# Patient Record
Sex: Female | Born: 1975 | Hispanic: No | Marital: Married | Smoking: Never smoker
Health system: Southern US, Community
[De-identification: ages and names within clinical notes are randomized; demographics above are authoritative.]

## PROBLEM LIST (undated history)

## (undated) DIAGNOSIS — D649 Anemia, unspecified: Secondary | ICD-10-CM

## (undated) DIAGNOSIS — I10 Essential (primary) hypertension: Secondary | ICD-10-CM

## (undated) DIAGNOSIS — E559 Vitamin D deficiency, unspecified: Secondary | ICD-10-CM

## (undated) HISTORY — DX: Anemia, unspecified: D64.9

## (undated) HISTORY — DX: Vitamin D deficiency, unspecified: E55.9

## (undated) HISTORY — DX: Essential (primary) hypertension: I10

---

## 2021-10-31 DIAGNOSIS — Z6841 Body Mass Index (BMI) 40.0 and over, adult: Secondary | ICD-10-CM | POA: Diagnosis not present

## 2021-10-31 DIAGNOSIS — R635 Abnormal weight gain: Secondary | ICD-10-CM | POA: Diagnosis not present

## 2021-10-31 DIAGNOSIS — I10 Essential (primary) hypertension: Secondary | ICD-10-CM | POA: Diagnosis not present

## 2021-11-10 ENCOUNTER — Other Ambulatory Visit (HOSPITAL_BASED_OUTPATIENT_CLINIC_OR_DEPARTMENT_OTHER): Payer: Self-pay

## 2021-11-10 DIAGNOSIS — R0683 Snoring: Secondary | ICD-10-CM

## 2021-11-10 DIAGNOSIS — R5383 Other fatigue: Secondary | ICD-10-CM

## 2021-11-10 DIAGNOSIS — I10 Essential (primary) hypertension: Secondary | ICD-10-CM | POA: Diagnosis not present

## 2021-11-10 DIAGNOSIS — E785 Hyperlipidemia, unspecified: Secondary | ICD-10-CM | POA: Diagnosis not present

## 2021-11-10 DIAGNOSIS — Z0000 Encounter for general adult medical examination without abnormal findings: Secondary | ICD-10-CM | POA: Diagnosis not present

## 2021-11-10 DIAGNOSIS — R0681 Apnea, not elsewhere classified: Secondary | ICD-10-CM

## 2021-11-25 ENCOUNTER — Other Ambulatory Visit: Payer: Self-pay | Admitting: Family Medicine

## 2021-11-25 DIAGNOSIS — Z1231 Encounter for screening mammogram for malignant neoplasm of breast: Secondary | ICD-10-CM

## 2021-12-22 DIAGNOSIS — Z0000 Encounter for general adult medical examination without abnormal findings: Secondary | ICD-10-CM | POA: Diagnosis not present

## 2021-12-22 DIAGNOSIS — Z124 Encounter for screening for malignant neoplasm of cervix: Secondary | ICD-10-CM | POA: Diagnosis not present

## 2021-12-23 ENCOUNTER — Ambulatory Visit
Admission: RE | Admit: 2021-12-23 | Discharge: 2021-12-23 | Disposition: A | Discharge status: Home / Self Care | Payer: BC Managed Care – PPO | Source: Ambulatory Visit | Attending: Family Medicine | Admitting: Family Medicine

## 2021-12-23 ENCOUNTER — Other Ambulatory Visit: Payer: Self-pay | Admitting: Family Medicine

## 2021-12-23 ENCOUNTER — Other Ambulatory Visit: Payer: Self-pay

## 2021-12-23 DIAGNOSIS — M25511 Pain in right shoulder: Secondary | ICD-10-CM

## 2022-01-18 ENCOUNTER — Ambulatory Visit: Payer: BC Managed Care – PPO

## 2022-06-10 DIAGNOSIS — L0201 Cutaneous abscess of face: Secondary | ICD-10-CM | POA: Diagnosis not present

## 2022-08-02 DIAGNOSIS — D649 Anemia, unspecified: Secondary | ICD-10-CM | POA: Diagnosis not present

## 2022-08-02 DIAGNOSIS — R635 Abnormal weight gain: Secondary | ICD-10-CM | POA: Diagnosis not present

## 2022-08-02 DIAGNOSIS — Z1329 Encounter for screening for other suspected endocrine disorder: Secondary | ICD-10-CM | POA: Diagnosis not present

## 2022-08-02 DIAGNOSIS — E559 Vitamin D deficiency, unspecified: Secondary | ICD-10-CM | POA: Diagnosis not present

## 2022-08-02 DIAGNOSIS — R7303 Prediabetes: Secondary | ICD-10-CM | POA: Diagnosis not present

## 2022-08-02 DIAGNOSIS — E7800 Pure hypercholesterolemia, unspecified: Secondary | ICD-10-CM | POA: Diagnosis not present

## 2022-08-02 DIAGNOSIS — N951 Menopausal and female climacteric states: Secondary | ICD-10-CM | POA: Diagnosis not present

## 2022-08-04 DIAGNOSIS — Z1331 Encounter for screening for depression: Secondary | ICD-10-CM | POA: Diagnosis not present

## 2022-08-04 DIAGNOSIS — Z1339 Encounter for screening examination for other mental health and behavioral disorders: Secondary | ICD-10-CM | POA: Diagnosis not present

## 2022-08-04 DIAGNOSIS — E559 Vitamin D deficiency, unspecified: Secondary | ICD-10-CM | POA: Diagnosis not present

## 2022-08-10 DIAGNOSIS — E7800 Pure hypercholesterolemia, unspecified: Secondary | ICD-10-CM | POA: Diagnosis not present

## 2022-08-17 DIAGNOSIS — R7303 Prediabetes: Secondary | ICD-10-CM | POA: Diagnosis not present

## 2022-08-29 DIAGNOSIS — M25562 Pain in left knee: Secondary | ICD-10-CM | POA: Diagnosis not present

## 2022-08-29 DIAGNOSIS — E7800 Pure hypercholesterolemia, unspecified: Secondary | ICD-10-CM | POA: Diagnosis not present

## 2022-09-05 DIAGNOSIS — R7303 Prediabetes: Secondary | ICD-10-CM | POA: Diagnosis not present

## 2022-09-05 DIAGNOSIS — E559 Vitamin D deficiency, unspecified: Secondary | ICD-10-CM | POA: Diagnosis not present

## 2022-09-05 DIAGNOSIS — Z6841 Body Mass Index (BMI) 40.0 and over, adult: Secondary | ICD-10-CM | POA: Diagnosis not present

## 2022-09-19 DIAGNOSIS — R7303 Prediabetes: Secondary | ICD-10-CM | POA: Diagnosis not present

## 2022-09-19 DIAGNOSIS — Z6841 Body Mass Index (BMI) 40.0 and over, adult: Secondary | ICD-10-CM | POA: Diagnosis not present

## 2022-09-28 DIAGNOSIS — J208 Acute bronchitis due to other specified organisms: Secondary | ICD-10-CM | POA: Diagnosis not present

## 2022-09-28 DIAGNOSIS — H6691 Otitis media, unspecified, right ear: Secondary | ICD-10-CM | POA: Diagnosis not present

## 2022-10-04 DIAGNOSIS — J208 Acute bronchitis due to other specified organisms: Secondary | ICD-10-CM | POA: Insufficient documentation

## 2022-10-10 ENCOUNTER — Encounter: Payer: Self-pay | Admitting: Radiology

## 2022-10-10 ENCOUNTER — Ambulatory Visit: Payer: BC Managed Care – PPO | Admitting: Radiology

## 2022-10-10 VITALS — BP 144/96 | Ht 61.25 in | Wt 222.0 lb

## 2022-10-10 DIAGNOSIS — N939 Abnormal uterine and vaginal bleeding, unspecified: Secondary | ICD-10-CM | POA: Diagnosis not present

## 2022-10-10 LAB — PREGNANCY, URINE: Preg Test, Ur: NEGATIVE

## 2022-10-10 NOTE — Progress Notes (Signed)
   Katrina Moss 09/10/1976 [id number]   History:  47 y.o. G4P4 presents as a new patient with c/o heavy irregular bleeding. She had a 30day period starting 11/25, went from heavy to light then heavy again. Stopped for 2-3 days then started up again and was bright red and heavy x 6 days. No pain,or other associated symptoms. She denies any other problems. Has never had a heavy period or prolonged bleeding like this before. She has changed her diet to help with weight loss but otherwise no new changes.   Gynecologic History Patient's last menstrual period was 09/25/2022 (exact date). Period Pattern: (!) Irregular Contraception/Family planning: vasectomy Sexually active: yes   Obstetric History OB History  Gravida Para Term Preterm AB Living  '4 4       3  '$ SAB IAB Ectopic Multiple Live Births               # Outcome Date GA Lbr Len/2nd Weight Sex Delivery Anes PTL Lv  4 Para           3 Para           2 Para           1 Para              The following portions of the patient's history were reviewed and updated as appropriate: allergies, current medications, past family history, past medical history, past social history, past surgical history, and problem list.  Review of Systems Pertinent items noted in HPI and remainder of comprehensive ROS otherwise negative.   Past medical history, past surgical history, family history and social history were all reviewed and documented in the EPIC chart.   Exam:  Vitals:   10/10/22 1039 10/10/22 1050  BP: (!) 136/98 (!) 144/96  Weight: 222 lb (100.7 kg)   Height: 5' 1.25" (1.556 m)    Body mass index is 41.6 kg/m.  General appearance:  Normal Abdominal  Soft,nontender, without masses, guarding or rebound.  Liver/spleen:  No organomegaly noted  Hernia:  None appreciated  Skin  Inspection:  Grossly normal Genitourinary   Inguinal/mons:  Normal without inguinal adenopathy  External genitalia:  Normal appearing vulva with no  masses, tenderness, or lesions  BUS/Urethra/Skene's glands:  Normal without masses or exudate  Vagina:  Normal appearing with normal color and discharge, no lesions  Cervix:  Normal appearing without discharge or lesions  Uterus:  Normal in size, shape and contour.  Mobile, nontender  Adnexa/parametria:     Rt: Normal in size, without masses or tenderness.   Lt: Normal in size, without masses or tenderness.  Anus and perineum: Normal   Patient informed chaperone available to be present for breast and pelvic exam. Patient has requested no chaperone to be present. Patient has been advised what will be completed during breast and pelvic exam.   Assessment/Plan:   1. Abnormal vaginal bleeding  - Pregnancy, urine; negative - US Transvaginal Non-OB; Future   Will wait to see what u/s shows before starting any medication to manage. Pt agreeable to plan.  Rubbie Battiest B WHNP-BC 11:15 AM 10/10/2022

## 2022-10-12 ENCOUNTER — Ambulatory Visit (INDEPENDENT_AMBULATORY_CARE_PROVIDER_SITE_OTHER): Payer: BC Managed Care – PPO

## 2022-10-12 ENCOUNTER — Ambulatory Visit (INDEPENDENT_AMBULATORY_CARE_PROVIDER_SITE_OTHER): Payer: BC Managed Care – PPO | Admitting: Radiology

## 2022-10-12 VITALS — BP 138/84

## 2022-10-12 DIAGNOSIS — N8003 Adenomyosis of the uterus: Secondary | ICD-10-CM | POA: Diagnosis not present

## 2022-10-12 DIAGNOSIS — N939 Abnormal uterine and vaginal bleeding, unspecified: Secondary | ICD-10-CM

## 2022-10-12 DIAGNOSIS — D219 Benign neoplasm of connective and other soft tissue, unspecified: Secondary | ICD-10-CM | POA: Diagnosis not present

## 2022-10-12 DIAGNOSIS — N921 Excessive and frequent menstruation with irregular cycle: Secondary | ICD-10-CM | POA: Diagnosis not present

## 2022-10-12 DIAGNOSIS — E611 Iron deficiency: Secondary | ICD-10-CM | POA: Diagnosis not present

## 2022-10-12 DIAGNOSIS — D75839 Thrombocytosis, unspecified: Secondary | ICD-10-CM | POA: Diagnosis not present

## 2022-10-12 DIAGNOSIS — D500 Iron deficiency anemia secondary to blood loss (chronic): Secondary | ICD-10-CM

## 2022-10-12 NOTE — Progress Notes (Signed)
   Katrina Moss 1976-07-25 [id number]   History:  47 y.o. G4P4 presents for follow up U/S after presenting as a new patient with c/o heavy irregular bleeding. She had a 30day period starting 11/25, went from heavy to light then heavy again. Stopped for 2-3 days then started up again and was bright red and heavy x 6 days. No pain,or other associated symptoms. She denies any other problems. Has never had a heavy period or prolonged bleeding like this before. She has changed her diet to help with weight loss but otherwise no new changes.   Gynecologic History Patient's last menstrual period was 09/25/2022 (exact date).   Contraception/Family planning: vasectomy Sexually active: yes   Obstetric History OB History  Gravida Para Term Preterm AB Living  '4 4       3  '$ SAB IAB Ectopic Multiple Live Births               # Outcome Date GA Lbr Len/2nd Weight Sex Delivery Anes PTL Lv  4 Para           3 Para           2 Para           1 Para              The following portions of the patient's history were reviewed and updated as appropriate: allergies, current medications, past family history, past medical history, past social history, past surgical history, and problem list.  Review of Systems Pertinent items noted in HPI and remainder of comprehensive ROS otherwise negative.   Past medical history, past surgical history, family history and social history were all reviewed and documented in the EPIC chart.   Exam:  Vitals:   10/12/22 1522  BP: 138/84   There is no height or weight on file to calculate BMI.  Narrative & Impression Indication: menorrhagia   Vaginal u/s   Anteverted slightly enlarged fibroid uterus 10.66 x 7.03 x 6.03cm In homogenous myometrium with streaky shadowing ?adenomyosis Several small fibroid with the largest measuring 1.87 x 1.55cm   Thin, symmetrical endometrium 3.8m No masses or thickening seen    Both ovaries normal size with normal follicle  pattern Dominant follicle pattern on the right side 26 x 173mcycle day 17   No adnexal mass No free fluid   Impression: fibroids, adenomyosis    Assessment/Plan:   1. Fibroids 2. Adenomyosis 3. Menorrhagia with irregular cycle - CBC - Ferritin - B12 and Folate Panel 4. Iron deficiency anemia due to chronic blood loss  - CBC - Ferritin - B12 and Folate Panel   Discussed adenomyosis and fibroid with patient. Discussed treatment options including expectant management, IUD placement, OCPs, Lysteda, Myfembree. Will consider and return with decision.   CHRubbie Battiest WHNP-BC 4:36 PM 10/12/2022

## 2022-10-13 ENCOUNTER — Telehealth: Payer: Self-pay

## 2022-10-13 DIAGNOSIS — R7989 Other specified abnormal findings of blood chemistry: Secondary | ICD-10-CM

## 2022-10-13 LAB — CBC
HCT: 35.9 % (ref 35.0–45.0)
Hemoglobin: 12.1 g/dL (ref 11.7–15.5)
MCH: 27.9 pg (ref 27.0–33.0)
MCHC: 33.7 g/dL (ref 32.0–36.0)
MCV: 82.7 fL (ref 80.0–100.0)
MPV: 9.4 fL (ref 7.5–12.5)
Platelets: 518 10*3/uL — ABNORMAL HIGH (ref 140–400)
RBC: 4.34 10*6/uL (ref 3.80–5.10)
RDW: 13.3 % (ref 11.0–15.0)
WBC: 6 10*3/uL (ref 3.8–10.8)

## 2022-10-13 LAB — B12 AND FOLATE PANEL
Folate: 9.8 ng/mL
Vitamin B-12: 613 pg/mL (ref 200–1100)

## 2022-10-13 LAB — FERRITIN: Ferritin: 14 ng/mL — ABNORMAL LOW (ref 16–232)

## 2022-10-13 NOTE — Telephone Encounter (Signed)
Per result note from 10/14/2022--referral sent.

## 2022-10-26 DIAGNOSIS — R7303 Prediabetes: Secondary | ICD-10-CM | POA: Diagnosis not present

## 2022-11-03 ENCOUNTER — Encounter: Payer: Self-pay | Admitting: Hematology and Oncology

## 2022-11-03 DIAGNOSIS — D75839 Thrombocytosis, unspecified: Secondary | ICD-10-CM | POA: Insufficient documentation

## 2022-11-03 DIAGNOSIS — E611 Iron deficiency: Secondary | ICD-10-CM | POA: Insufficient documentation

## 2022-11-06 ENCOUNTER — Inpatient Hospital Stay: Payer: BC Managed Care – PPO | Attending: Hematology and Oncology | Admitting: Hematology and Oncology

## 2022-11-06 ENCOUNTER — Inpatient Hospital Stay: Payer: BC Managed Care – PPO

## 2022-11-06 ENCOUNTER — Encounter: Payer: Self-pay | Admitting: Hematology and Oncology

## 2022-11-06 VITALS — BP 152/104 | HR 89 | Temp 98.6°F | Resp 18 | Ht 62.0 in | Wt 223.6 lb

## 2022-11-06 DIAGNOSIS — E611 Iron deficiency: Secondary | ICD-10-CM | POA: Insufficient documentation

## 2022-11-06 DIAGNOSIS — N921 Excessive and frequent menstruation with irregular cycle: Secondary | ICD-10-CM | POA: Insufficient documentation

## 2022-11-06 DIAGNOSIS — I10 Essential (primary) hypertension: Secondary | ICD-10-CM | POA: Insufficient documentation

## 2022-11-06 DIAGNOSIS — D75838 Other thrombocytosis: Secondary | ICD-10-CM | POA: Insufficient documentation

## 2022-11-06 DIAGNOSIS — Z803 Family history of malignant neoplasm of breast: Secondary | ICD-10-CM | POA: Diagnosis not present

## 2022-11-06 DIAGNOSIS — D75839 Thrombocytosis, unspecified: Secondary | ICD-10-CM

## 2022-11-06 NOTE — Assessment & Plan Note (Signed)
I would defer to her gynecologist for management

## 2022-11-06 NOTE — Progress Notes (Signed)
[facility] CONSULT NOTE  Patient Care Team: Pcp, No as PCP - General  ASSESSMENT & PLAN:  Iron deficiency Most likely cause of her iron deficiency is due to chronic menorrhagia She appears to be tolerating oral iron supplement well I recommend she continues the same I plan to repeat her blood work again in a few months for further follow-up If she is not able to improve her iron studies with oral iron supplement, I will bring her in and prescribe intravenous iron replacement therapy At her age, she will be due for colonoscopy I recommend the patient to get established with new primary care doctor If she is not able to get established with a primary care doctor by her next visit, I will set up referral for her to get colonoscopy screening  Thrombocytosis This is reactive thrombocytosis related to iron deficiency I anticipate resolution of thrombocytosis with adequate oral iron replacement therapy  Menorrhagia with irregular cycle I would defer to her gynecologist for management Orders Placed This Encounter  Procedures   Iron and Iron Binding Capacity ([facility] only)    Standing Status:   Future    Standing Expiration Date:   11/07/2023   Ferritin    Standing Status:   Future    Standing Expiration Date:   11/06/2023   CBC with Differential (Cancer Center Only)    Standing Status:   Future    Standing Expiration Date:   11/07/2023   Reticulocytes    Standing Status:   Future    Standing Expiration Date:   11/07/2023   Sedimentation rate    Standing Status:   Future    Standing Expiration Date:   11/07/2023    All questions were answered. The patient knows to call the clinic with any problems, questions or concerns.  The total time spent in the appointment was 55 minutes encounter with patients including review of chart and various tests results, discussions about plan of care and coordination of care plan  [name], MD 2/5/20243:07 PM   CHIEF COMPLAINTS/PURPOSE  OF CONSULTATION:  Elevated platelet count  HISTORY OF PRESENTING ILLNESS:  Katrina Moss 47 y.o. female is here because of elevated platelet count She is here accompanied by her husband Both of them just relocated from [place] and of last year The patient was otherwise healthy Starting around end of November, she started to develop irregular menstruation with the continuous menstrual bleed that lasted for several weeks According to the patient, she started to have irregular menstruation since around 2021 Prior to that, on average, she has 6 days of menstrual cycle every month that was regular in pattern The patient has 3 pregnancies and normal vaginal delivery After continuous bleeding, her menstrual cycles subsequently ceased early January but then resume again around January 13 and lasted for some time She has no further menstrual bleeding since end of January She started taking oral iron supplement recently with an empty stomach She denies side effects from iron supplement From the elevated platelet standpoint, she has never suffered from a blood clot We do not have any baseline blood work due to recent relocation from [place]:  Past Medical History:  Diagnosis Date   Hypertension     SURGICAL HISTORY: History reviewed. No pertinent surgical history.  SOCIAL HISTORY: Social History   Socioeconomic History   Marital status: Unknown    Spouse name: Not on file   Number of children: 3   Years of education: Not on  file   Highest education level: Not on file  Occupational History   Not on file  Tobacco Use   Smoking status: Never    Passive exposure: Never   Smokeless tobacco: Never  Substance and Sexual Activity   Alcohol use: Not Currently   Drug use: Never   Sexual activity: Yes    Partners: Male    Birth control/protection: Surgical    Comment: partner vasectomy, menarche 78yo, sexual debut 47yo  Other Topics Concern   Not on file   Social History Narrative   Not on file   Social Determinants of Health   Financial Resource Strain: Not on file  Food Insecurity: Not on file  Transportation Needs: Not on file  Physical Activity: Not on file  Stress: Not on file  Social Connections: Not on file  Intimate Partner Violence: Not on file    FAMILY HISTORY: Family History  Problem Relation Age of Onset   Breast cancer Mother        over 33 at onset per patient    ALLERGIES:  is allergic to pamelor [nortriptyline].  MEDICATIONS:  Current Outpatient Medications  Medication Sig Dispense Refill   Cholecalciferol (VITAMIN D3) 50 MCG (2000 UT) capsule Take 2,000 Units by mouth daily.     Ferrous Sulfate (IRON PO) Take 1 tablet by mouth at bedtime.     MAGNESIUM PO Take by mouth.     No current facility-administered medications for this visit.    REVIEW OF SYSTEMS:   Constitutional: Denies fevers, chills or abnormal night sweats Eyes: Denies blurriness of vision, double vision or watery eyes Ears, nose, mouth, throat, and face: Denies mucositis or sore throat Respiratory: Denies cough, dyspnea or wheezes Cardiovascular: Denies palpitation, chest discomfort or lower extremity swelling Gastrointestinal:  Denies nausea, heartburn or change in bowel habits Skin: Denies abnormal skin rashes Lymphatics: Denies new lymphadenopathy or easy bruising Neurological:Denies numbness, tingling or new weaknesses Behavioral/Psych: Mood is stable, no new changes  All other systems were reviewed with the patient and are negative.  PHYSICAL EXAMINATION: ECOG PERFORMANCE STATUS: 0 - Asymptomatic  Vitals:   11/06/22 1114  BP: (!) 152/104  Pulse: 89  Resp: 18  Temp: 98.6 F (37 C)  SpO2: 100%   Filed Weights   11/06/22 1114  Weight: 223 lb 9.6 oz (101.4 kg)    GENERAL:alert, no distress and comfortable SKIN: skin color, texture, turgor are normal, no rashes or significant lesions EYES: normal, conjunctiva are pink  and non-injected, sclera clear OROPHARYNX:no exudate, no erythema and lips, buccal mucosa, and tongue normal  NECK: supple, thyroid normal size, non-tender, without nodularity LYMPH:  no palpable lymphadenopathy in the cervical, axillary or inguinal LUNGS: clear to auscultation and percussion with normal breathing effort HEART: regular rate & rhythm and no murmurs and no lower extremity edema ABDOMEN:abdomen soft, non-tender and normal bowel sounds Musculoskeletal:no cyanosis of digits and no clubbing  PSYCH: alert & oriented x 3 with fluent speech NEURO: no focal motor/sensory deficits  RADIOGRAPHIC STUDIES: I have personally reviewed the radiological images as listed and agreed with the findings in the report. US Transvaginal Non-OB  Result Date: 10/12/2022 Indication: menorrhagia Vaginal u/s Anteverted slightly enlarged fibroid uterus 10.66 x 7.03 x 6.03cm In homogenous myometrium with streaky shadowing ?adenomyosis Several small fibroid with the largest measuring 1.87 x 1.55cm Thin, symmetrical endometrium 3.28m No masses or thickening seen Both ovaries normal size with normal follicle pattern Dominant follicle pattern on the right side 26 x 140mcycle day  17 No adnexal mass No free fluid Impression: fibroids, adenomyosis

## 2022-11-06 NOTE — Assessment & Plan Note (Addendum)
Most likely cause of her iron deficiency is due to chronic menorrhagia She appears to be tolerating oral iron supplement well I recommend she continues the same I plan to repeat her blood work again in a few months for further follow-up If she is not able to improve her iron studies with oral iron supplement, I will bring her in and prescribe intravenous iron replacement therapy At her age, she will be due for colonoscopy I recommend the patient to get established with new primary care doctor If she is not able to get established with a primary care doctor by her next visit, I will set up referral for her to get colonoscopy screening

## 2022-11-06 NOTE — Assessment & Plan Note (Signed)
This is reactive thrombocytosis related to iron deficiency I anticipate resolution of thrombocytosis with adequate oral iron replacement therapy

## 2022-11-09 DIAGNOSIS — E7800 Pure hypercholesterolemia, unspecified: Secondary | ICD-10-CM | POA: Diagnosis not present

## 2022-11-09 DIAGNOSIS — E559 Vitamin D deficiency, unspecified: Secondary | ICD-10-CM | POA: Diagnosis not present

## 2022-11-09 DIAGNOSIS — Z6841 Body Mass Index (BMI) 40.0 and over, adult: Secondary | ICD-10-CM | POA: Diagnosis not present

## 2022-11-10 NOTE — Telephone Encounter (Signed)
Pt was seen on 11/06/2022. Will close encounter.

## 2023-01-30 ENCOUNTER — Inpatient Hospital Stay: Payer: BC Managed Care – PPO | Attending: Hematology and Oncology

## 2023-01-30 ENCOUNTER — Other Ambulatory Visit: Payer: Self-pay

## 2023-01-30 DIAGNOSIS — D509 Iron deficiency anemia, unspecified: Secondary | ICD-10-CM | POA: Diagnosis not present

## 2023-01-30 DIAGNOSIS — E611 Iron deficiency: Secondary | ICD-10-CM

## 2023-01-30 DIAGNOSIS — D75839 Thrombocytosis, unspecified: Secondary | ICD-10-CM

## 2023-01-30 DIAGNOSIS — N921 Excessive and frequent menstruation with irregular cycle: Secondary | ICD-10-CM | POA: Insufficient documentation

## 2023-01-30 LAB — CBC WITH DIFFERENTIAL (CANCER CENTER ONLY)
Abs Immature Granulocytes: 0.01 10*3/uL (ref 0.00–0.07)
Basophils Absolute: 0 10*3/uL (ref 0.0–0.1)
Basophils Relative: 0 %
Eosinophils Absolute: 0.1 10*3/uL (ref 0.0–0.5)
Eosinophils Relative: 1 %
HCT: 34.9 % — ABNORMAL LOW (ref 36.0–46.0)
Hemoglobin: 11.7 g/dL — ABNORMAL LOW (ref 12.0–15.0)
Immature Granulocytes: 0 %
Lymphocytes Relative: 42 %
Lymphs Abs: 2 10*3/uL (ref 0.7–4.0)
MCH: 28.3 pg (ref 26.0–34.0)
MCHC: 33.5 g/dL (ref 30.0–36.0)
MCV: 84.3 fL (ref 80.0–100.0)
Monocytes Absolute: 0.4 10*3/uL (ref 0.1–1.0)
Monocytes Relative: 8 %
Neutro Abs: 2.3 10*3/uL (ref 1.7–7.7)
Neutrophils Relative %: 49 %
Platelet Count: 393 10*3/uL (ref 150–400)
RBC: 4.14 MIL/uL (ref 3.87–5.11)
RDW: 13.3 % (ref 11.5–15.5)
WBC Count: 4.8 10*3/uL (ref 4.0–10.5)
nRBC: 0 % (ref 0.0–0.2)

## 2023-01-30 LAB — FERRITIN: Ferritin: 13 ng/mL (ref 11–307)

## 2023-01-30 LAB — RETICULOCYTES
Immature Retic Fract: 4 % (ref 2.3–15.9)
RBC.: 4.24 MIL/uL (ref 3.87–5.11)
Retic Count, Absolute: 41.6 10*3/uL (ref 19.0–186.0)
Retic Ct Pct: 1 % (ref 0.4–3.1)

## 2023-01-30 LAB — SEDIMENTATION RATE: Sed Rate: 43 mm/hr — ABNORMAL HIGH (ref 0–22)

## 2023-01-30 LAB — IRON AND IRON BINDING CAPACITY (CC-WL,HP ONLY)
Iron: 52 ug/dL (ref 28–170)
Saturation Ratios: 12 % (ref 10.4–31.8)
TIBC: 445 ug/dL (ref 250–450)
UIBC: 393 ug/dL (ref 148–442)

## 2023-02-01 ENCOUNTER — Telehealth: Payer: Self-pay | Admitting: Hematology and Oncology

## 2023-02-01 ENCOUNTER — Inpatient Hospital Stay: Payer: BC Managed Care – PPO | Attending: Hematology and Oncology | Admitting: Hematology and Oncology

## 2023-02-01 ENCOUNTER — Encounter: Payer: Self-pay | Admitting: Hematology and Oncology

## 2023-02-01 DIAGNOSIS — N921 Excessive and frequent menstruation with irregular cycle: Secondary | ICD-10-CM

## 2023-02-01 DIAGNOSIS — E611 Iron deficiency: Secondary | ICD-10-CM

## 2023-02-01 NOTE — Assessment & Plan Note (Signed)
She has worsening anemia with persistent iron deficiency despite oral iron supplement The most likely cause of her anemia is due to chronic blood loss/malabsorption syndrome. We discussed some of the risks, benefits, and alternatives of intravenous iron infusions. The patient is symptomatic from anemia and the iron level is critically low. She tolerated oral iron supplement poorly and desires to achieved higher levels of iron faster for adequate hematopoesis. Some of the side-effects to be expected including risks of infusion reactions, phlebitis, headaches, nausea and fatigue.  The patient is willing to proceed. Patient education material was dispensed.  Goal is to keep ferritin level greater than 50 and resolution of anemia She agreed to proceed with IV iron I recommend 1 dose of iron sucrose and repeat labs in 3 months

## 2023-02-01 NOTE — Progress Notes (Signed)
HEMATOLOGY-ONCOLOGY ELECTRONIC VISIT PROGRESS NOTE  Patient Care Team: Pcp, No as PCP - General  I connected with the patient via telephone conference and verified that I am speaking with the correct person using two identifiers. The patient's location is at home and I am providing care from the [facility] I discussed the limitations, risks, security and privacy concerns of performing an evaluation and management service by e-visits and the availability of in person appointments.  I also discussed with the patient that there may be a patient responsible charge related to this service. The patient expressed understanding and agreed to proceed.   ASSESSMENT & PLAN:  Iron deficiency She has worsening anemia with persistent iron deficiency despite oral iron supplement The most likely cause of her anemia is due to chronic blood loss/malabsorption syndrome. We discussed some of the risks, benefits, and alternatives of intravenous iron infusions. The patient is symptomatic from anemia and the iron level is critically low. She tolerated oral iron supplement poorly and desires to achieved higher levels of iron faster for adequate hematopoesis. Some of the side-effects to be expected including risks of infusion reactions, phlebitis, headaches, nausea and fatigue.  The patient is willing to proceed. Patient education material was dispensed.  Goal is to keep ferritin level greater than 50 and resolution of anemia She agreed to proceed with IV iron I recommend 1 dose of iron sucrose and repeat labs in 3 months  Menorrhagia with irregular cycle Her menorrhagia has improved She will continue follow-up with gynecologist  Orders Placed This Encounter  Procedures   Iron and Iron Binding Capacity ([facility] only)    Standing Status:   Future    Standing Expiration Date:   02/01/2024   Ferritin    Standing Status:   Future    Standing Expiration Date:   02/01/2024   CBC with Differential (Cancer Center  Only)    Standing Status:   Future    Standing Expiration Date:   02/01/2024    INTERVAL HISTORY: Please see below for problem oriented charting. The purpose of today's discussion is to review recent test results Since her last visit, she denies menorrhagia Her energy level is fair but she has persistent fatigue The patient denies any recent signs or symptoms of bleeding such as spontaneous epistaxis, hematuria or hematochezia.  Summary of hematological history Katrina Moss 47 y.o. female is here because of elevated platelet count She is here accompanied by her husband Both of them just relocated from [place] and of last year The patient was otherwise healthy Starting around end of November, she started to develop irregular menstruation with the continuous menstrual bleed that lasted for several weeks According to the patient, she started to have irregular menstruation since around 2021 Prior to that, on average, she has 6 days of menstrual cycle every month that was regular in pattern The patient has 3 pregnancies and normal vaginal delivery After continuous bleeding, her menstrual cycles subsequently ceased early January but then resume again around January 13 and lasted for some time She has no further menstrual bleeding since end of January She started taking oral iron supplement recently with an empty stomach She denies side effects from iron supplement From the elevated platelet standpoint, she has never suffered from a blood clot We do not have any baseline blood work due to recent relocation from [place] She was seen in February 2024 with plan to continue oral iron supplement and recheck in a few months  REVIEW OF SYSTEMS:  Constitutional: Denies fevers, chills or abnormal weight loss Eyes: Denies blurriness of vision Ears, nose, mouth, throat, and face: Denies mucositis or sore throat Respiratory: Denies cough, dyspnea or wheezes Cardiovascular: Denies  palpitation, chest discomfort Gastrointestinal:  Denies nausea, heartburn or change in bowel habits Skin: Denies abnormal skin rashes Lymphatics: Denies new lymphadenopathy or easy bruising Neurological:Denies numbness, tingling or new weaknesses Behavioral/Psych: Mood is stable, no new changes  Extremities: No lower extremity edema All other systems were reviewed with the patient and are negative.  I have reviewed the past medical history, past surgical history, social history and family history with the patient and they are unchanged from previous note.  ALLERGIES:  is allergic to pamelor [nortriptyline].  MEDICATIONS:  Current Outpatient Medications  Medication Sig Dispense Refill   Cholecalciferol (VITAMIN D3) 50 MCG (2000 UT) capsule Take 2,000 Units by mouth daily.     Ferrous Sulfate (IRON PO) Take 1 tablet by mouth at bedtime.     MAGNESIUM PO Take by mouth.     No current facility-administered medications for this visit.    PHYSICAL EXAMINATION: ECOG PERFORMANCE STATUS: 0 - Asymptomatic  LABORATORY DATA:  I have reviewed the data as listed     No data to display          Lab Results  Component Value Date   WBC 4.8 01/30/2023   HGB 11.7 (L) 01/30/2023   HCT 34.9 (L) 01/30/2023   MCV 84.3 01/30/2023   PLT 393 01/30/2023   NEUTROABS 2.3 01/30/2023    I discussed the assessment and treatment plan with the patient. The patient was provided an opportunity to ask questions and all were answered. The patient agreed with the plan and demonstrated an understanding of the instructions. The patient was advised to call back or seek an in-person evaluation if the symptoms worsen or if the condition fails to improve as anticipated.    I spent 20 minutes for the appointment reviewing test results, discuss management and coordination of care.  Artis Delay, MD 02/01/2023 11:02 AM

## 2023-02-01 NOTE — Telephone Encounter (Signed)
Spoke with patient confirming upcoming appointments  

## 2023-02-01 NOTE — Assessment & Plan Note (Signed)
Her menorrhagia has improved She will continue follow-up with gynecologist

## 2023-02-20 ENCOUNTER — Telehealth: Payer: Self-pay

## 2023-02-20 ENCOUNTER — Inpatient Hospital Stay: Payer: BC Managed Care – PPO

## 2023-02-20 ENCOUNTER — Encounter: Payer: Self-pay | Admitting: Hematology and Oncology

## 2023-02-20 NOTE — Telephone Encounter (Signed)
Called and left a message that she did not show up for 8 am IV iron appt today. Ask her to call scheduling to reschedule appt.

## 2023-02-27 NOTE — Progress Notes (Deleted)
   New Patient Office Visit  Subjective    Patient ID: Katrina Moss, female    DOB: 1976-04-14  Age: 47 y.o. MRN: [id number]  CC: No chief complaint on file.   HPI Tynika Bedel presents to establish care with new provider.  Patients previous primary care provider was   Specialist: Hematology-[facility] with Dr. Artis Delay.  [facility] with Arlie Solomons, NP Outpatient Encounter Medications as of 02/28/2023  Medication Sig   Cholecalciferol (VITAMIN D3) 50 MCG (2000 UT) capsule Take 2,000 Units by mouth daily.   Ferrous Sulfate (IRON PO) Take 1 tablet by mouth at bedtime.   MAGNESIUM PO Take by mouth.   No facility-administered encounter medications on file as of 02/28/2023.    Past Medical History:  Diagnosis Date   Hypertension     No past surgical history on file.  Family History  Problem Relation Age of Onset   Breast cancer Mother        over 82 at onset per patient    Social History   Socioeconomic History   Marital status: Married    Spouse name: Not on file   Number of children: 3   Years of education: Not on file   Highest education level: Not on file  Occupational History   Not on file  Tobacco Use   Smoking status: Never    Passive exposure: Never   Smokeless tobacco: Never  Substance and Sexual Activity   Alcohol use: Not Currently   Drug use: Never   Sexual activity: Yes    Partners: Male    Birth control/protection: Surgical    Comment: partner vasectomy, menarche 89yo, sexual debut 47yo  Other Topics Concern   Not on file  Social History Narrative   Not on file   Social Determinants of Health   Financial Resource Strain: Not on file  Food Insecurity: Not on file  Transportation Needs: Not on file  Physical Activity: Not on file  Stress: Not on file  Social Connections: Not on file  Intimate Partner Violence: Not on file    ROS See HPI above    Objective     There were no vitals taken for this visit.  Physical Exam    Assessment & Plan:  There are no diagnoses linked to this encounter.  No follow-ups on file.  1.Review health maintenance:  -Covid vaccine -HIV and Hep C  -Tetanus vaccine -Pap smear -Colonoscopy Zandra Abts, NP

## 2023-02-28 ENCOUNTER — Ambulatory Visit: Payer: BC Managed Care – PPO | Admitting: Family Medicine

## 2023-02-28 DIAGNOSIS — I10 Essential (primary) hypertension: Secondary | ICD-10-CM | POA: Insufficient documentation

## 2023-03-01 ENCOUNTER — Encounter: Payer: Self-pay | Admitting: Family Medicine

## 2023-03-02 ENCOUNTER — Ambulatory Visit: Payer: BC Managed Care – PPO | Admitting: Family Medicine

## 2023-03-02 ENCOUNTER — Encounter: Payer: Self-pay | Admitting: Family Medicine

## 2023-03-02 ENCOUNTER — Telehealth: Payer: Self-pay

## 2023-03-02 VITALS — BP 124/78 | HR 82 | Temp 98.2°F | Ht 62.0 in | Wt 220.2 lb

## 2023-03-02 DIAGNOSIS — Z114 Encounter for screening for human immunodeficiency virus [HIV]: Secondary | ICD-10-CM | POA: Diagnosis not present

## 2023-03-02 DIAGNOSIS — Z0000 Encounter for general adult medical examination without abnormal findings: Secondary | ICD-10-CM | POA: Diagnosis not present

## 2023-03-02 DIAGNOSIS — Z13228 Encounter for screening for other metabolic disorders: Secondary | ICD-10-CM

## 2023-03-02 DIAGNOSIS — Z1211 Encounter for screening for malignant neoplasm of colon: Secondary | ICD-10-CM

## 2023-03-02 DIAGNOSIS — Z1329 Encounter for screening for other suspected endocrine disorder: Secondary | ICD-10-CM | POA: Diagnosis not present

## 2023-03-02 DIAGNOSIS — Z1159 Encounter for screening for other viral diseases: Secondary | ICD-10-CM

## 2023-03-02 DIAGNOSIS — Z7689 Persons encountering health services in other specified circumstances: Secondary | ICD-10-CM

## 2023-03-02 DIAGNOSIS — Z13220 Encounter for screening for lipoid disorders: Secondary | ICD-10-CM

## 2023-03-02 DIAGNOSIS — Z1231 Encounter for screening mammogram for malignant neoplasm of breast: Secondary | ICD-10-CM

## 2023-03-02 LAB — LIPID PANEL
Cholesterol: 198 mg/dL (ref 0–200)
HDL: 56.5 mg/dL (ref >39.00)
LDL Cholesterol: 129 mg/dL — ABNORMAL HIGH (ref 0–99)
NonHDL: 141.31
Total CHOL/HDL Ratio: 4
Triglycerides: 60 mg/dL (ref 0.0–149.0)
VLDL: 12 mg/dL (ref 0.0–40.0)

## 2023-03-02 LAB — COMPREHENSIVE METABOLIC PANEL
ALT: 7 U/L (ref 0–35)
AST: 13 U/L (ref 0–37)
Albumin: 4.5 g/dL (ref 3.5–5.2)
Alkaline Phosphatase: 75 U/L (ref 39–117)
BUN: 9 mg/dL (ref 6–23)
CO2: 29 mEq/L (ref 19–32)
Calcium: 9.4 mg/dL (ref 8.4–10.5)
Chloride: 103 mEq/L (ref 96–112)
Creatinine, Ser: 0.68 mg/dL (ref 0.40–1.20)
GFR: 104.93 mL/min (ref >60.00)
Glucose, Bld: 101 mg/dL — ABNORMAL HIGH (ref 70–99)
Potassium: 4.2 mEq/L (ref 3.5–5.1)
Sodium: 137 mEq/L (ref 135–145)
Total Bilirubin: 0.5 mg/dL (ref 0.2–1.2)
Total Protein: 8 g/dL (ref 6.0–8.3)

## 2023-03-02 LAB — HEMOGLOBIN A1C: Hgb A1c MFr Bld: 5.8 % (ref 4.6–6.5)

## 2023-03-02 LAB — TSH: TSH: 1.2 u[IU]/mL (ref 0.35–5.50)

## 2023-03-02 NOTE — Progress Notes (Signed)
New Patient Office Visit  Subjective    Patient ID: Katrina Moss, female    DOB: 1976-08-15  Age: 47 y.o. MRN: [id number]  CC:  Chief Complaint  Patient presents with   Establish Care    Pt is here today to [facility]. Pt would like to discuss her iron. Pt is FASTING    HPI Katrina Moss presents to establish care with new provider.   Patients previous primary care provider was with [facility]. Had like 2 visits. Prior to that went to Dr. Army Melia with [facility] in [place], [place]. Last seen in 2021.   Specialist:  Hematology-[facility] - Eat with Dr. Artis Delay GYN: [facility] [place] with Arlie Solomons, NP.  Patient is concerned about obtaining IV iron transfusions that was recommended by hematology and verbalized she does not have support from her spouse. She reports she does feel fatigue, but some days are harder than others. She is reports she has started questioning herself if she is feeling short of breath.   Outpatient Encounter Medications as of 03/02/2023  Medication Sig   Cholecalciferol (VITAMIN D3) 50 MCG (2000 UT) capsule Take 2,000 Units by mouth daily.   Ferrous Sulfate (IRON PO) Take 1 tablet by mouth at bedtime.   MAGNESIUM PO Take by mouth.   No facility-administered encounter medications on file as of 03/02/2023.    Past Medical History:  Diagnosis Date   Anemia    Hypertension    Vitamin D deficiency     History reviewed. No pertinent surgical history.  Family History  Problem Relation Age of Onset   Breast cancer Mother        over 2 at onset per patient   Hypertension Father    Diabetes Father    Stroke Maternal Grandmother    Glaucoma Paternal Grandmother    Kidney disease Paternal Grandfather     Social History   Socioeconomic History   Marital status: Married    Spouse name: Not on file   Number of children: 4   Years of education: Not  on file   Highest education level: Master's degree (e.g., MA, MS, MEng, MEd, MSW, MBA)  Occupational History   Occupation: Home maker  Tobacco Use   Smoking status: Never    Passive exposure: Never   Smokeless tobacco: Never  Vaping Use   Vaping Use: Never used  Substance and Sexual Activity   Alcohol use: Yes    Comment: 1x every 40month   Drug use: Never   Sexual activity: Yes    Partners: Male    Birth control/protection: Surgical    Comment: partner vasectomy, menarche 13yo, sexual debut 47yo  Other Topics Concern   Not on file  Social History Narrative   Not on file   Social Determinants of Health   Financial Resource Strain: Low Risk  (03/02/2023)   Overall Financial Resource Strain (CARDIA)    Difficulty of Paying Living Expenses: Not hard at all  Food Insecurity: No Food Insecurity (03/01/2023)   Hunger Vital Sign    Worried About Running Out of Food in the Last Year: Never true    Ran Out of Food in the Last Year: Never true  Transportation Needs: No Transportation Needs (03/01/2023)   PRAPARE - Administrator, Civil Service (Medical): No    Lack of Transportation (Non-Medical): No  Physical Activity: Insufficiently Active (03/02/2023)   Exercise Vital Sign  Days of Exercise per Week: 3 days    Minutes of Exercise per Session: 30 min  Stress: No Stress Concern Present (03/02/2023)   [employer] of Occupational Health - Occupational Stress Questionnaire    Feeling of Stress : Only a little  Social Connections: Socially Integrated (03/01/2023)   Social Connection and Isolation Panel [NHANES]    Frequency of Communication with Friends and Family: Three times a week    Frequency of Social Gatherings with Friends and Family: Twice a week    Attends Religious Services: More than 4 times per year    Active Member of [employer] or Organizations: Yes    Attends Engineer, structural: More than 4 times per year    Marital Status: Married  Careers information officer Violence: Not At Risk (03/01/2023)   Humiliation, Afraid, Rape, and Kick questionnaire    Fear of Current or Ex-Partner: No    Emotionally Abused: No    Physically Abused: No    Sexually Abused: No    ROS See HPI above    Objective   BP 124/78   Pulse 82   Temp 98.2 F (36.8 C)   Ht 5\' 2"  (1.575 m)   Wt 220 lb 4 oz (99.9 kg)   SpO2 98%   BMI 40.28 kg/m   Physical Exam Vitals reviewed.  Constitutional:      General: She is not in acute distress.    Appearance: Normal appearance. She is obese. She is not ill-appearing, toxic-appearing or diaphoretic.  HENT:     Head: Normocephalic and atraumatic.  Eyes:     General:        Right eye: No discharge.        Left eye: No discharge.     Conjunctiva/sclera: Conjunctivae normal.  Cardiovascular:     Rate and Rhythm: Normal rate and regular rhythm.     Heart sounds: Normal heart sounds. No murmur heard.    No friction rub. No gallop.  Pulmonary:     Effort: Pulmonary effort is normal. No respiratory distress.     Breath sounds: Normal breath sounds.  Musculoskeletal:        General: Normal range of motion.  Skin:    General: Skin is warm and dry.  Neurological:     General: No focal deficit present.     Mental Status: She is alert and oriented to person, place, and time. Mental status is at baseline.  Psychiatric:        Mood and Affect: Mood normal.        Behavior: Behavior normal.        Thought Content: Thought content normal.        Judgment: Judgment normal.      Assessment & Plan:  Encounter to establish care  Colon cancer screening -     Ambulatory referral to Gastroenterology  Encounter for screening mammogram for malignant neoplasm of breast -     MM 3D DIAGNOSTIC MAMMOGRAM BILATERAL BREAST  Need for hepatitis C screening test -     Hepatitis C antibody  Encounter for screening for HIV -     HIV Antibody (routine testing w rflx)  Lipid screening -     Lipid panel  Screening for  endocrine disorder -     Hemoglobin A1c -     TSH  Encounter for screening for other metabolic disorders -     Comprehensive metabolic panel  1.Reviewed health maintenance: -Declines covid vaccine and booster -Ordered  HIV and Hep C screening. -Declines Td vaccine  -Pap smear-last year or 2022; request records from [facility] in [place]. -Referral placed for colonoscopy with GI for colon cancer screening.  -Ordered mammogram for breast cancer screening.  2.Ordered lipid screening, A1c, TSH, and CMP.  3.Recommend to follow up with hematology about low ferritin level and have her spouse be present at the visit for more understanding of the importance for the transfusion.   Return in about 3 months (around 06/02/2023) for chronic management.   Zandra Abts, NP

## 2023-03-02 NOTE — Telephone Encounter (Signed)
She called and left a message. She missed recent IV iron appt. She would like another appt to see Dr. Bertis Ruddy to discuss the IV iron. She has alot of questions/ concerns about the IV iron. The appt can be in person or virtual, but she would like her husband to be part of the conversation.

## 2023-03-02 NOTE — Telephone Encounter (Signed)
Called and scheduled appt at 1145 on 6/3 for virtual visit. She is aware of appt

## 2023-03-02 NOTE — Telephone Encounter (Signed)
How about Monday before 9 am or in between hem consults? Please proceed and schedule, virtual visit is ok

## 2023-03-02 NOTE — Patient Instructions (Signed)
-  It was a pleasure to meet you today and I look forward to taking care of you. -Recommend to follow up with hematology about low ferritin (low iron storage level).  -Ordered screening labs. Office will with lab results and you may see them in MyChart.  -Placed a referral for a colonoscopy with GI.  -Ordered mammogram.  -If you do not hear back in 2 weeks about the referral and mammogram, please call back to the office. -Follow up in 3 months for a physical.

## 2023-03-03 LAB — HEPATITIS C ANTIBODY: Hepatitis C Ab: NONREACTIVE

## 2023-03-03 LAB — HIV ANTIBODY (ROUTINE TESTING W REFLEX): HIV 1&2 Ab, 4th Generation: NONREACTIVE

## 2023-03-05 ENCOUNTER — Inpatient Hospital Stay: Payer: BC Managed Care – PPO | Attending: Hematology and Oncology | Admitting: Hematology and Oncology

## 2023-03-05 ENCOUNTER — Encounter: Payer: Self-pay | Admitting: Hematology and Oncology

## 2023-03-05 ENCOUNTER — Telehealth: Payer: Self-pay

## 2023-03-05 DIAGNOSIS — E611 Iron deficiency: Secondary | ICD-10-CM

## 2023-03-05 NOTE — Assessment & Plan Note (Signed)
From our prior discussion, the patient agree for intravenous iron infusion The patient did not show up for her IV iron recently and started to question the rationale behind intravenous iron infusion Today, I have the opportunity to discuss the rationale behind IV iron with the patient and her husband Ultimately, I felt that the patient is leaning towards not pursuing intravenous iron infusion which I think is also a reasonable approach I recommend the patient to focus on lifestyle changes and to consider increasing oral iron supplement as tolerated Treatment of menorrhagia would also be an important aspect for 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

## 2023-03-05 NOTE — Telephone Encounter (Signed)
-----   Message from Alveria Apley, NP sent at 03/05/2023  8:06 AM EDT ----- Bad cholesterol (LDL) is slightly elevated, recommend diet changes- increase intake of fresh fruits and vegetables, increase intake of lean proteins. Bake, broil, or grill foods. Avoid fried, greasy, and fatty foods. Avoid fast foods. Increase intake of fiber-rich whole grains. Exercise encouraged - at least 150 minutes per week and advance as tolerated. Cholesterol is not high enough to warrant medication therapy.  A1c is prediabetes, recommend to decrease carbohydrates and sweets, and exercise as mentioned above.

## 2023-03-05 NOTE — Progress Notes (Signed)
HEMATOLOGY-ONCOLOGY ELECTRONIC VISIT PROGRESS NOTE  Patient Care Team: Alveria Apley, NP as PCP - General (Family Medicine)  I connected with the patient via telephone conference and verified that I am speaking with the correct person using two identifiers. The patient's location is at home and I am providing care from the [facility] I discussed the limitations, risks, security and privacy concerns of performing an evaluation and management service by e-visits and the availability of in person appointments.  I also discussed with the patient that there may be a patient responsible charge related to this service. The patient expressed understanding and agreed to proceed.   ASSESSMENT & PLAN:  Iron deficiency From our prior discussion, the patient agree for intravenous iron infusion The patient did not show up for her IV iron recently and started to question the rationale behind intravenous iron infusion Today, I have the opportunity to discuss the rationale behind IV iron with the patient and her husband Ultimately, I felt that the patient is leaning towards not pursuing intravenous iron infusion which I think is also a reasonable approach I recommend the patient to focus on lifestyle changes and to consider increasing oral iron supplement as tolerated Treatment of menorrhagia would also be an important aspect for 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   INTERVAL HISTORY: Please see below for problem oriented charting. The purpose of today's discussion is to review recent blood work and to discuss the rationale for intravenous iron infusion Her husband was able to join the call visit today We  reviewed recent blood work and discussed the risk and benefits of intravenous iron infusion  Summary of hematological history Katrina Moss 47 y.o. female is here because of elevated platelet count Both of them just relocated from [place] and of last year The patient was otherwise healthy Starting around end of November, she started to develop irregular menstruation with the continuous menstrual bleed that lasted for several weeks According to the patient, she started to have irregular menstruation since around 2021 Prior to that, on average, she has 6 days of menstrual cycle every month that was regular in pattern The patient has 3 pregnancies and normal vaginal delivery After continuous bleeding, her menstrual cycles subsequently ceased early January but then resume again around January 13 and lasted for some time She has no further menstrual bleeding since end of January She started taking oral iron supplement recently with an empty stomach She denies side effects from iron supplement From the elevated platelet standpoint, she has never suffered from a blood clot We do not have any baseline blood work due to recent relocation from [place] She was seen in February 2024 with plan to continue oral iron supplement and recheck in a few months Repeat blood work in May confirm recurrent anemia with iron deficiency  REVIEW OF SYSTEMS:   Constitutional: Denies fevers, chills or abnormal weight loss Eyes: Denies blurriness of vision Ears, nose, mouth, throat, and face: Denies mucositis or sore throat Respiratory: Denies cough, dyspnea or wheezes Cardiovascular: Denies palpitation, chest discomfort Gastrointestinal:  Denies nausea, heartburn or change in bowel habits Skin: Denies abnormal skin rashes Lymphatics: Denies new lymphadenopathy or easy bruising Neurological:Denies numbness, tingling or new weaknesses Behavioral/Psych:  Mood is stable, no new changes  Extremities: No lower  extremity edema All other systems were reviewed with the patient and are negative.  I have reviewed the past medical history, past surgical history, social history and family history with the patient and they are unchanged from previous note.  ALLERGIES:  is allergic to pamelor [nortriptyline].  MEDICATIONS:  Current Outpatient Medications  Medication Sig Dispense Refill   Cholecalciferol (VITAMIN D3) 50 MCG (2000 UT) capsule Take 2,000 Units by mouth daily.     Ferrous Sulfate (IRON PO) Take 1 tablet by mouth at bedtime.     MAGNESIUM PO Take by mouth.     No current facility-administered medications for this visit.    PHYSICAL EXAMINATION: ECOG PERFORMANCE STATUS: 0 - Asymptomatic  LABORATORY DATA:  I have reviewed the data as listed    Latest Ref Rng & Units 03/02/2023   11:05 AM  CMP  Glucose 70 - 99 mg/dL 161   BUN 6 - 23 mg/dL 9   Creatinine 0.96 - 0.45 mg/dL 4.09   Sodium 811 - 914 mEq/L 137   Potassium 3.5 - 5.1 mEq/L 4.2   Chloride 96 - 112 mEq/L 103   CO2 19 - 32 mEq/L 29   Calcium 8.4 - 10.5 mg/dL 9.4   Total Protein 6.0 - 8.3 g/dL 8.0   Total Bilirubin 0.2 - 1.2 mg/dL 0.5   Alkaline Phos 39 - 117 U/L 75   AST 0 - 37 U/L 13   ALT 0 - 35 U/L 7     Lab Results  Component Value Date   WBC 4.8 01/30/2023   HGB 11.7 (L) 01/30/2023   HCT 34.9 (L) 01/30/2023   MCV 84.3 01/30/2023   PLT 393 01/30/2023   NEUTROABS 2.3 01/30/2023    I discussed the assessment and treatment plan with the patient. The patient was provided an opportunity to ask questions and all were answered. The patient agreed with the plan and demonstrated an understanding of the instructions. The patient was advised to call back or seek an in-person evaluation if the symptoms worsen or if the condition fails to improve as anticipated.    I spent 20 minutes for the appointment reviewing test results, discuss management and coordination of care.  Artis Delay, MD 03/05/2023 10:44 AM

## 2023-04-18 ENCOUNTER — Ambulatory Visit: Payer: BC Managed Care – PPO | Admitting: *Deleted

## 2023-04-18 VITALS — Ht 62.0 in | Wt 220.0 lb

## 2023-04-18 DIAGNOSIS — Z1211 Encounter for screening for malignant neoplasm of colon: Secondary | ICD-10-CM

## 2023-04-18 MED ORDER — NA SULFATE-K SULFATE-MG SULF 17.5-3.13-1.6 GM/177ML PO SOLN
1.0000 | Freq: Once | ORAL | 0 refills | Status: AC
Start: 2023-04-18 — End: 2023-04-18

## 2023-04-18 NOTE — Progress Notes (Signed)
Pt's name and DOB verified at the beginning of the pre-visit.  Pt denies any difficulty with ambulating,sitting, laying down or rolling side to side Gave both [facility] main # and MD on call # prior to instructions.  No egg or soy allergy known to patient  Patient denies ever being intubated Pt has no issues moving head neck or swallowing No FH of Malignant Hyperthermia Pt is not on diet pills Pt is not on home 02  Pt is not on blood thinners  Pt denies issues with constipation  Pt is not on dialysis Pt denise any abnormal heart rhythms  Pt denies any upcoming cardiac testing Pt encouraged to use to use Singlecare or Goodrx to reduce cost  Patient's chart reviewed by Cathlyn Parsons CNRA prior to pre-visit and patient appropriate for the [facility].  Pre-visit completed and red dot placed by patient's name on their procedure day (on provider's schedule).  . Visit by phone Pt states weight is 220 lb Instructed pt why it is important to and  to call if they have any changes in health or new medications. Directed them to the # given and on instructions.   Pt states they will.  Instructions reviewed with pt and pt states understanding. Instructed to review again prior to procedure. Pt states they will.  Instructions sent by mail with coupon and by my chart Pt drinks beet juice instructed not to drink 5 days prior to procedure

## 2023-05-02 ENCOUNTER — Other Ambulatory Visit: Payer: BC Managed Care – PPO

## 2023-05-02 ENCOUNTER — Encounter: Payer: Self-pay | Admitting: Gastroenterology

## 2023-05-02 ENCOUNTER — Encounter (INDEPENDENT_AMBULATORY_CARE_PROVIDER_SITE_OTHER): Payer: Self-pay

## 2023-05-04 ENCOUNTER — Telehealth: Payer: BC Managed Care – PPO | Admitting: Hematology and Oncology

## 2023-05-07 ENCOUNTER — Encounter: Payer: Self-pay | Admitting: Gastroenterology

## 2023-05-07 ENCOUNTER — Ambulatory Visit (AMBULATORY_SURGERY_CENTER): Payer: BC Managed Care – PPO | Admitting: Gastroenterology

## 2023-05-07 VITALS — BP 123/77 | HR 59 | Temp 97.8°F | Resp 13 | Ht 62.0 in | Wt 220.0 lb

## 2023-05-07 DIAGNOSIS — Z1211 Encounter for screening for malignant neoplasm of colon: Secondary | ICD-10-CM

## 2023-05-07 MED ORDER — SODIUM CHLORIDE 0.9 % IV SOLN: 500.0000 mL | Freq: Once | INTRAVENOUS | Status: DC

## 2023-05-07 NOTE — Progress Notes (Signed)
[facility] History and Physical   Primary Care Physician:  Alveria Apley, NP   Reason for Procedure:    CRC screening  Plan:     colonoscopy     HPI: Katrina Moss is a 47 y.o. female   Mild anemia d/t  Past Medical History:  Diagnosis Date   Anemia    Hypertension    Vitamin D deficiency     History reviewed. No pertinent surgical history.  Prior to Admission medications   Medication Sig Start Date End Date Taking? Authorizing Provider  Cholecalciferol (VITAMIN D3) 50 MCG (2000 UT) capsule Take 2,000 Units by mouth daily.    [provider]  Ferrous Sulfate (IRON PO) Take 1 tablet by mouth at bedtime.    [provider]  MAGNESIUM PO Take by mouth.    [provider]    Current Outpatient Medications  Medication Sig Dispense Refill   Cholecalciferol (VITAMIN D3) 50 MCG (2000 UT) capsule Take 2,000 Units by mouth daily.     Ferrous Sulfate (IRON PO) Take 1 tablet by mouth at bedtime.     MAGNESIUM PO Take by mouth.     Current Facility-Administered Medications  Medication Dose Route Frequency Provider Last Rate Last Admin   0.9 %  sodium chloride infusion  500 mL Intravenous Once Lynann Bologna, MD        Allergies as of 05/07/2023 - Review Complete 05/07/2023  Allergen Reaction Noted   Pamelor [nortriptyline] Other (See Comments) 10/10/2022    Family History  Problem Relation Age of Onset   Breast cancer Mother        over 33 at onset per patient   Hypertension Father    Diabetes Father    Stroke Maternal Grandmother    Glaucoma Paternal Grandmother    Kidney disease Paternal Grandfather    Colon polyps Neg Hx    Colon cancer Neg Hx    Esophageal cancer Neg Hx    Rectal cancer Neg Hx    Stomach cancer Neg Hx     Social History   Socioeconomic History   Marital status: Married    Spouse name: Not on file   Number of children: 4   Years of education: Not on file   Highest education level: Master's  degree (e.g., MA, MS, MEng, MEd, MSW, MBA)  Occupational History   Occupation: Home maker  Tobacco Use   Smoking status: Never    Passive exposure: Never   Smokeless tobacco: Never  Vaping Use   Vaping status: Never Used  Substance and Sexual Activity   Alcohol use: Yes    Comment: 1x every 59month   Drug use: Never   Sexual activity: Yes    Partners: Male    Comment: partner vasectomy, menarche 13yo, sexual debut 47yo  Other Topics Concern   Not on file  Social History Narrative   Not on file   Social Determinants of Health   Financial Resource Strain: Low Risk  (03/02/2023)   Overall Financial Resource Strain (CARDIA)    Difficulty of Paying Living Expenses: Not hard at all  Food Insecurity: No Food Insecurity (03/01/2023)   Hunger Vital Sign    Worried About Running Out of Food in the Last Year: Never true    Ran Out of Food in the Last Year: Never true  Transportation Needs: No Transportation Needs (03/01/2023)   PRAPARE - Administrator, Civil Service (Medical): No    Lack of Transportation (Non-Medical): No  Physical Activity: Insufficiently Active (03/02/2023)   Exercise Vital Sign    Days of Exercise per Week: 3 days    Minutes of Exercise per Session: 30 min  Stress: No Stress Concern Present (03/02/2023)   [employer] of Occupational Health - Occupational Stress Questionnaire    Feeling of Stress : Only a little  Social Connections: Socially Integrated (03/01/2023)   Social Connection and Isolation Panel [NHANES]    Frequency of Communication with Friends and Family: Three times a week    Frequency of Social Gatherings with Friends and Family: Twice a week    Attends Religious Services: More than 4 times per year    Active Member of [employer] or Organizations: Yes    Attends Engineer, structural: More than 4 times per year    Marital Status: Married  Catering manager Violence: Not At Risk (03/01/2023)   Humiliation, Afraid, Rape, and Kick  questionnaire    Fear of Current or Ex-Partner: No    Emotionally Abused: No    Physically Abused: No    Sexually Abused: No    Review of Systems: Positive for none All other review of systems negative except as mentioned in the HPI.  Physical Exam: Vital signs in last 24 hours: @VSRANGES @   General:   Alert,  Well-developed, well-nourished, pleasant and cooperative in NAD Lungs:  Clear throughout to auscultation.   Heart:  Regular rate and rhythm; no murmurs, clicks, rubs,  or gallops. Abdomen:  Soft, nontender and nondistended. Normal bowel sounds.   Neuro/Psych:  Alert and cooperative. Normal mood and affect. A and O x 3    No significant changes were identified.  The patient continues to be an appropriate candidate for the planned procedure and anesthesia.   Edman Circle, MD. [facility] 05/07/2023 10:04 AM@

## 2023-05-07 NOTE — Patient Instructions (Signed)
Continue present medications. Please read handouts provided. Repeat colonoscopy in 10 years for screening, earlier with any new symptoms or changes in family history. High Fiber Diet.   YOU HAD AN ENDOSCOPIC PROCEDURE TODAY AT THE [facility]:   Refer to the procedure report that was given to you for any specific questions about what was found during the examination.  If the procedure report does not answer your questions, please call your gastroenterologist to clarify.  If you requested that your care partner not be given the details of your procedure findings, then the procedure report has been included in a sealed envelope for you to review at your convenience later.  YOU SHOULD EXPECT: Some feelings of bloating in the abdomen. Passage of more gas than usual.  Walking can help get rid of the air that was put into your GI tract during the procedure and reduce the bloating. If you had a lower endoscopy (such as a colonoscopy or flexible sigmoidoscopy) you may notice spotting of blood in your stool or on the toilet paper. If you underwent a bowel prep for your procedure, you may not have a normal bowel movement for a few days.  Please Note:  You might notice some irritation and congestion in your nose or some drainage.  This is from the oxygen used during your procedure.  There is no need for concern and it should clear up in a day or so.  SYMPTOMS TO REPORT IMMEDIATELY:  Following lower endoscopy (colonoscopy or flexible sigmoidoscopy):  Excessive amounts of blood in the stool  Significant tenderness or worsening of abdominal pains  Swelling of the abdomen that is new, acute  Fever of 100F or higher   For urgent or emergent issues, a gastroenterologist can be reached at any hour by calling [phone]. Do not use MyChart messaging for urgent concerns.    DIET:  We do recommend a small meal at first, but then you may proceed to your regular diet.  Drink plenty of fluids but  you should avoid alcoholic beverages for 24 hours.  ACTIVITY:  You should plan to take it easy for the rest of today and you should NOT DRIVE or use heavy machinery until tomorrow (because of the sedation medicines used during the test).    FOLLOW UP: Our staff will call the number listed on your records the next business day following your procedure.  We will call around 7:15- 8:00 am to check on you and address any questions or concerns that you may have regarding the information given to you following your procedure. If we do not reach you, we will leave a message.     If any biopsies were taken you will be contacted by phone or by letter within the next 1-3 weeks.  Please call us at [phone] if you have not heard about the biopsies in 3 weeks.    SIGNATURES/CONFIDENTIALITY: You and/or your care partner have signed paperwork which will be entered into your electronic medical record.  These signatures attest to the fact that that the information above on your After Visit Summary has been reviewed and is understood.  Full responsibility of the confidentiality of this discharge information lies with you and/or your care-partner.

## 2023-05-07 NOTE — Progress Notes (Signed)
Uneventful anesthetic. Report to pacu rn. Vss. Care resumed by rn. 

## 2023-05-07 NOTE — Op Note (Signed)
[facility] Patient Name: Katrina Moss Procedure Date: 05/07/2023 9:52 AM MRN: [id number] Endoscopist: Lynann Bologna , MD, [id number] Age: 47 Referring MD:  Date of Birth: 20-Oct-1975 Gender: Female Account #: [id number] Procedure:                Colonoscopy Indications:              Screening for colorectal malignant neoplasm Medicines:                Monitored Anesthesia Care Procedure:                Pre-Anesthesia Assessment:                           - Prior to the procedure, a History and Physical                            was performed, and patient medications and                            allergies were reviewed. The patient's tolerance of                            previous anesthesia was also reviewed. The risks                            and benefits of the procedure and the sedation                            options and risks were discussed with the patient.                            All questions were answered, and informed consent                            was obtained. Prior Anticoagulants: The patient has                            taken no anticoagulant or antiplatelet agents. ASA                            Grade Assessment: II - A patient with mild systemic                            disease. After reviewing the risks and benefits,                            the patient was deemed in satisfactory condition to                            undergo the procedure.                           After obtaining informed consent, the colonoscope  was passed under direct vision. Throughout the                            procedure, the patient's blood pressure, pulse, and                            oxygen saturations were monitored continuously. The                            CF [id number] #[id number] was introduced through the anus                            and advanced to the 1 cm into the ileum. The                            colonoscopy was  performed without difficulty. The                            patient tolerated the procedure well. The quality                            of the bowel preparation was good. The terminal                            ileum, ileocecal valve, appendiceal orifice, and                            rectum were photographed. Scope In: 10:14:56 AM Scope Out: 10:26:30 AM Scope Withdrawal Time: 0 hours 7 minutes 27 seconds  Total Procedure Duration: 0 hours 11 minutes 34 seconds  Findings:                 Rare small-mouthed diverticula were found in the                            sigmoid colon and cecum.                           Non-bleeding internal hemorrhoids were found during                            retroflexion. The hemorrhoids were small and Grade                            I (internal hemorrhoids that do not prolapse).                           The terminal ileum appeared normal.                           The exam was otherwise without abnormality on                            direct and retroflexion views. Complications:  No immediate complications. Estimated Blood Loss:     Estimated blood loss: none. Impression:               - Very minimal colonic diverticulosis                           - Non-bleeding internal hemorrhoids.                           - The examined portion of the ileum was normal.                           - The examination was otherwise normal on direct                            and retroflexion views.                           - No specimens collected. Recommendation:           - Patient has a contact number available for                            emergencies. The signs and symptoms of potential                            delayed complications were discussed with the                            patient. Return to normal activities tomorrow.                            Written discharge instructions were provided to the                            patient.                            - High fiber diet.                           - Continue present medications.                           - Repeat colonoscopy in 10 years for screening                            purposes. Earlier, with any new problems or change                            in family history.                           - The findings and recommendations were discussed                            with the patient's family. Lynann Bologna,  MD 05/07/2023 10:30:10 AM This report has been signed electronically.

## 2023-05-07 NOTE — Progress Notes (Signed)
Vitals-DT  Pt's states no medical or surgical changes since previsit or office visit.  

## 2023-05-08 ENCOUNTER — Telehealth: Payer: Self-pay

## 2023-05-08 NOTE — Telephone Encounter (Signed)
  Follow up Call-     05/07/2023    9:59 AM 05/07/2023    9:50 AM  Call back number  Post procedure Call Back phone  # [phone]   Permission to leave phone message  Yes     Patient questions:  Do you have a fever, pain , or abdominal swelling? No. Pain Score  0 *  Have you tolerated food without any problems? Yes.    Have you been able to return to your normal activities? Yes.    Do you have any questions about your discharge instructions: Diet   No. Medications  No. Follow up visit  No.  Do you have questions or concerns about your Care? No.  Actions: * If pain score is 4 or above: No action needed, pain <4.

## 2023-06-01 ENCOUNTER — Ambulatory Visit: Payer: BC Managed Care – PPO | Admitting: Family Medicine

## 2023-06-07 ENCOUNTER — Ambulatory Visit: Payer: BC Managed Care – PPO | Admitting: Family Medicine

## 2023-08-07 ENCOUNTER — Ambulatory Visit: Payer: BC Managed Care – PPO | Admitting: Medical

## 2023-08-07 ENCOUNTER — Encounter: Payer: Self-pay | Admitting: Medical

## 2023-08-07 VITALS — BP 140/88 | HR 84 | Temp 97.8°F | Resp 18 | Ht 62.0 in | Wt 230.0 lb

## 2023-08-07 DIAGNOSIS — R300 Dysuria: Secondary | ICD-10-CM

## 2023-08-07 LAB — POCT URINALYSIS DIPSTICK
Bilirubin, UA: NEGATIVE
Blood, UA: NEGATIVE
Glucose, UA: NEGATIVE
Ketones, UA: NEGATIVE
Nitrite, UA: NEGATIVE
Protein, UA: NEGATIVE
Spec Grav, UA: 1.01 (ref 1.010–1.025)
Urobilinogen, UA: 0.2 U/dL
pH, UA: 7 (ref 5.0–8.0)

## 2023-08-07 MED ORDER — CEPHALEXIN 500 MG PO CAPS: 500.0000 mg | ORAL_CAPSULE | Freq: Two times a day (BID) | ORAL | 0 refills | Status: DC

## 2023-08-07 MED ORDER — PHENAZOPYRIDINE HCL 100 MG PO TABS
100.0000 mg | ORAL_TABLET | Freq: Three times a day (TID) | ORAL | 0 refills | Status: DC | PRN
Start: 2023-08-07 — End: 2023-09-27

## 2023-08-07 NOTE — Patient Instructions (Addendum)
Urinary Tract Infection (UTI) Symptoms of frequent urination, dysuria, and suprapubic discomfort for approximately two weeks. Urinalysis shows trace leukocytes. No history of UTIs. -Start Keflex 500mg  twice daily for 10 days. -Send urine for culture and sensitivity. -Start Pyridium 1 tablet every 8 hours as needed for pain, 2-day supply. -Update patient when culture results are available. -if signs/symptoms change or worsen let us know  Blood pressure borderline 140/88. -low salt diet and mild exercise. Check bp daily over next 7-10 days. Schedule follow up with pcp as you may need low dose bp med   Follow up date to be determined after study result.

## 2023-08-07 NOTE — Progress Notes (Signed)
Subjective:    Patient ID: Katrina Moss, female    DOB: [birth year], 47 y.o.   MRN: [id number]  HPI  Discussed the use of AI scribe software for clinical note transcription with the patient, who gave verbal consent to proceed.  History of Present Illness   The patient, with no known history of urinary tract infections, presents with urinary symptoms persisting for approximately two weeks. Initially, she self-treated with over-the-counter Azo Standard, which provided temporary relief. However, symptoms have since returned, characterized by frequent urination, post-void burning, and a persistent sensation of needing to urinate over past 2 weeks.  The patient also reports intermittent lower back pain, suprapubic discomfort, and a burning sensation in the bladder area, particularly noticeable at night. She has noticed an unusual odor to her urine at times, particularly in the morning or midday. She denies any fever but has experienced occasional chills.  The patient's last menstrual cycle began on October 11th. She has not reported any new medications or changes in her existing regimen.       Recently pt bp at home have been intermittenlty elevated 140-150/90. Pt admits to not eating well. Also not exercising. Hx of htn. Former had been on atenolol until she stopped when pcp in [place] took her off med. But never tried any other med.  Review of Systems  Constitutional:  Negative for chills, fatigue and fever.  HENT:  Negative for congestion and ear discharge.   Respiratory:  Negative for cough, chest tightness, wheezing and stridor.   Cardiovascular:  Negative for chest pain and palpitations.  Gastrointestinal:  Negative for abdominal pain, blood in stool, diarrhea, rectal pain and vomiting.  Genitourinary:  Positive for dysuria, frequency and urgency.  Musculoskeletal:  Negative for back pain and myalgias.  Skin:  Negative for rash.  Neurological:  Negative for dizziness, tremors,  seizures, weakness and headaches.  Hematological:  Negative for adenopathy.  Psychiatric/Behavioral:  Negative for behavioral problems and decreased concentration. The patient is not nervous/anxious.    Past Medical History:  Diagnosis Date   Anemia    Hypertension    Vitamin D deficiency      Social History   Socioeconomic History   Marital status: Married    Spouse name: Not on file   Number of children: 4   Years of education: Not on file   Highest education level: Master's degree (e.g., MA, MS, MEng, MEd, MSW, MBA)  Occupational History   Occupation: Home maker  Tobacco Use   Smoking status: Never    Passive exposure: Never   Smokeless tobacco: Never  Vaping Use   Vaping status: Never Used  Substance and Sexual Activity   Alcohol use: Yes    Comment: 1x every 78month   Drug use: Never   Sexual activity: Yes    Partners: Male    Comment: partner vasectomy, menarche 13yo, sexual debut 47yo  Other Topics Concern   Not on file  Social History Narrative   Not on file   Social Determinants of Health   Financial Resource Strain: Low Risk  (03/02/2023)   Overall Financial Resource Strain (CARDIA)    Difficulty of Paying Living Expenses: Not hard at all  Food Insecurity: No Food Insecurity (03/01/2023)   Hunger Vital Sign    Worried About Running Out of Food in the Last Year: Never true    Ran Out of Food in the Last Year: Never true  Transportation Needs: No Transportation Needs (03/01/2023)   PRAPARE - Transportation  Lack of Transportation (Medical): No    Lack of Transportation (Non-Medical): No  Physical Activity: Insufficiently Active (03/02/2023)   Exercise Vital Sign    Days of Exercise per Week: 3 days    Minutes of Exercise per Session: 30 min  Stress: No Stress Concern Present (03/02/2023)   [employer] of Occupational Health - Occupational Stress Questionnaire    Feeling of Stress : Only a little  Social Connections: Socially Integrated (03/01/2023)    Social Connection and Isolation Panel [NHANES]    Frequency of Communication with Friends and Family: Three times a week    Frequency of Social Gatherings with Friends and Family: Twice a week    Attends Religious Services: More than 4 times per year    Active Member of [employer] or Organizations: Yes    Attends Engineer, structural: More than 4 times per year    Marital Status: Married  Catering manager Violence: Not At Risk (03/01/2023)   Humiliation, Afraid, Rape, and Kick questionnaire    Fear of Current or Ex-Partner: No    Emotionally Abused: No    Physically Abused: No    Sexually Abused: No    No past surgical history on file.  Family History  Problem Relation Age of Onset   Breast cancer Mother        over 11 at onset per patient   Hypertension Father    Diabetes Father    Stroke Maternal Grandmother    Glaucoma Paternal Grandmother    Kidney disease Paternal Grandfather    Colon polyps Neg Hx    Colon cancer Neg Hx    Esophageal cancer Neg Hx    Rectal cancer Neg Hx    Stomach cancer Neg Hx     Allergies  Allergen Reactions   Pamelor [Nortriptyline] Other (See Comments)    Stuttering, elevated BP    Current Outpatient Medications on File Prior to Visit  Medication Sig Dispense Refill   Cholecalciferol (VITAMIN D3) 50 MCG (2000 UT) capsule Take 2,000 Units by mouth daily.     Ferrous Sulfate (IRON PO) Take 1 tablet by mouth at bedtime.     MAGNESIUM PO Take by mouth.     No current facility-administered medications on file prior to visit.    BP (!) 140/88   Pulse 100   Temp 97.8 F (36.6 C)   Resp 18   Ht 5\' 2"  (1.575 m)   Wt 230 lb (104.3 kg)   SpO2 100%   BMI 42.07 kg/m          Objective:   Physical Exam  General- No acute distress. Pleasant patient. Neck- Full range of motion, no jvd Lungs- Clear, even and unlabored. Heart- regular rate and rhythm. Neurologic- CNII- XII grossly intact.  Abdomen- soft, nt, nd, +bs, faint  mideline suprabpuic tendern and slight tender rt of midline but no rlq tenderness. Back- no cva tenderness. Faint lumbar pain on exam     Assessment & Plan:   Assessment and Plan    Urinary Tract Infection (UTI) Symptoms of frequent urination, dysuria, and suprapubic discomfort for approximately two weeks. Urinalysis shows trace leukocytes. No history of UTIs. -Start Keflex 500mg  twice daily for 10 days. -Send urine for culture and sensitivity. -Start Pyridium 1 tablet every 8 hours as needed for pain, 2-day supply. -Update patient when culture results are available. -if signs/symptoms change or worsen let us know   Follow up date to be determined after study result.  Esperanza Richters, PA-C

## 2023-08-08 LAB — URINE CULTURE
MICRO NUMBER:: 15688158
SPECIMEN QUALITY:: ADEQUATE

## 2023-08-14 ENCOUNTER — Ambulatory Visit: Payer: BC Managed Care – PPO | Admitting: Family Medicine

## 2023-08-15 ENCOUNTER — Ambulatory Visit: Payer: BC Managed Care – PPO | Admitting: Radiology

## 2023-09-27 ENCOUNTER — Encounter: Payer: Self-pay | Admitting: Physician Assistant

## 2023-09-27 ENCOUNTER — Ambulatory Visit: Payer: BC Managed Care – PPO | Admitting: Physician Assistant

## 2023-09-27 VITALS — BP 141/91 | HR 87 | Temp 97.9°F | Ht 62.0 in | Wt 229.2 lb

## 2023-09-27 DIAGNOSIS — F4321 Adjustment disorder with depressed mood: Secondary | ICD-10-CM

## 2023-09-27 DIAGNOSIS — D485 Neoplasm of uncertain behavior of skin: Secondary | ICD-10-CM | POA: Diagnosis not present

## 2023-09-27 DIAGNOSIS — R0789 Other chest pain: Secondary | ICD-10-CM

## 2023-09-27 DIAGNOSIS — L910 Hypertrophic scar: Secondary | ICD-10-CM | POA: Diagnosis not present

## 2023-09-27 DIAGNOSIS — I10 Essential (primary) hypertension: Secondary | ICD-10-CM

## 2023-09-27 MED ORDER — AMLODIPINE BESYLATE 5 MG PO TABS
5.0000 mg | ORAL_TABLET | Freq: Every day | ORAL | 1 refills | Status: DC
Start: 2023-09-27 — End: 2024-07-09

## 2023-09-27 NOTE — Progress Notes (Signed)
Patient ID: Katrina Moss, female    DOB: 1975-10-06, 47 y.o.   MRN: [id number]   Assessment & Plan:  Essential hypertension -     EKG 12-Lead -     amLODIPine Besylate; Take 1 tablet (5 mg total) by mouth daily. Take to help lower blood pressure.  Dispense: 30 tablet; Refill: 1  Grief  Chest tightness   Assessment and Plan    Hypertension Elevated blood pressure readings with associated symptoms of headache, nasal pain, and mild chest discomfort. No acute distress or alarming symptoms. Family history of hypertension. -Start Amlodipine 5 mg daily, monitor for potential side effect of lower extremity swelling. -Check blood pressure in 3-4 weeks with primary care provider.  Stress Recent significant emotional stress due to family loss. -Consider mental health support as needed. Offered hydroxyzine today, pt declined.   General Health Maintenance -pt has scheduled annual gynecology appointment on 10/10/2023.       EKG performed today in office, NSR at 63 bpm per my review, some nonspecific T wave abnormalities noted; no prior EKG to compare to. Reviewed with Dr. Lutricia Horsfall in office, agreed no acute cardiac findings today. Possibly has changes noted due to stress and HTN.  Very strict ER precautions discussed with pt. She was agreeable with plan. She will f/up closely with her PCP.     Return in about 4 weeks (around 10/25/2023) for recheck/follow-up WITH PCP.    Subjective:    Chief Complaint  Patient presents with   Hypertension    161/112 this morning  Elevated BP since 07/2023    HPI Discussed the use of AI scribe software for clinical note transcription with the patient, who gave verbal consent to proceed.  History of Present Illness   Katrina Moss, a patient with a history of hypertension, presents with elevated blood pressure readings and associated symptoms. Over the past week, she has experienced headaches, nasal pain, and chest discomfort, which she  describes as a dull ache and slight tightness. She denies any exacerbation of these symptoms during physical activity such as walking or climbing stairs.  The patient also reports significant recent stress due to the suicide of her older brother. She is currently dealing with the aftermath of this event, including planning for the funeral. She acknowledges that this stress may be contributing to her elevated blood pressure and associated symptoms.  The patient has a family history of hypertension, with both parents affected. Her maternal grandmother had a stroke. She denies any health issues among her siblings.       Past Medical History:  Diagnosis Date   Anemia    Hypertension    Vitamin D deficiency    . History reviewed. No pertinent surgical history.  Family History  Problem Relation Age of Onset   Breast cancer Mother        over 72 at onset per patient   Hypertension Mother    Hypertension Father    Diabetes Father    Stroke Maternal Grandmother    Glaucoma Paternal Grandmother    Kidney disease Paternal Grandfather    Colon polyps Neg Hx    Colon cancer Neg Hx    Esophageal cancer Neg Hx    Rectal cancer Neg Hx    Stomach cancer Neg Hx     Social History   Tobacco Use   Smoking status: Never    Passive exposure: Never   Smokeless tobacco: Never  Vaping Use   Vaping status: Never Used  Substance Use Topics   Alcohol use: Yes    Comment: 1x every 81month   Drug use: Never     Allergies  Allergen Reactions   Pamelor [Nortriptyline] Other (See Comments)    Stuttering, elevated BP    Review of Systems NEGATIVE UNLESS OTHERWISE INDICATED IN HPI      Objective:     BP (!) 141/91   Pulse 87   Temp 97.9 F (36.6 C) (Temporal)   Ht 5\' 2"  (1.575 m)   Wt 229 lb 3.2 oz (104 kg)   LMP 08/27/2023   SpO2 99%   BMI 41.92 kg/m   Wt Readings from Last 3 Encounters:  09/27/23 229 lb 3.2 oz (104 kg)  08/07/23 230 lb (104.3 kg)  05/07/23 220 lb (99.8 kg)     BP Readings from Last 3 Encounters:  09/27/23 (!) 141/91  08/07/23 (!) 140/88  05/07/23 123/77     Physical Exam Vitals and nursing note reviewed.  Constitutional:      Appearance: Normal appearance. She is obese.  Eyes:     Extraocular Movements: Extraocular movements intact.     Conjunctiva/sclera: Conjunctivae normal.     Pupils: Pupils are equal, round, and reactive to light.  Cardiovascular:     Rate and Rhythm: Normal rate and regular rhythm.     Pulses: Normal pulses.     Heart sounds: Normal heart sounds. No murmur heard.    No friction rub. No gallop.  Pulmonary:     Effort: Pulmonary effort is normal.     Breath sounds: Normal breath sounds.  Neurological:     General: No focal deficit present.     Mental Status: She is alert and oriented to person, place, and time.  Psychiatric:     Comments: Depressed, slightly anxious           Mahsa Hanser M Magdaline Zollars, PA-C

## 2023-10-10 ENCOUNTER — Ambulatory Visit: Payer: BC Managed Care – PPO | Admitting: Radiology

## 2023-10-20 ENCOUNTER — Emergency Department (HOSPITAL_BASED_OUTPATIENT_CLINIC_OR_DEPARTMENT_OTHER): Payer: Medicaid Other

## 2023-10-20 ENCOUNTER — Other Ambulatory Visit: Payer: Self-pay

## 2023-10-20 ENCOUNTER — Emergency Department (HOSPITAL_BASED_OUTPATIENT_CLINIC_OR_DEPARTMENT_OTHER)
Admission: EM | Admit: 2023-10-20 | Discharge: 2023-10-20 | Disposition: A | Discharge status: Home / Self Care | Payer: Medicaid Other | Attending: Emergency Medicine | Admitting: Emergency Medicine

## 2023-10-20 DIAGNOSIS — R1031 Right lower quadrant pain: Secondary | ICD-10-CM | POA: Insufficient documentation

## 2023-10-20 DIAGNOSIS — Z79899 Other long term (current) drug therapy: Secondary | ICD-10-CM | POA: Insufficient documentation

## 2023-10-20 LAB — URINALYSIS, ROUTINE W REFLEX MICROSCOPIC
Bilirubin Urine: NEGATIVE
Glucose, UA: NEGATIVE mg/dL
Hgb urine dipstick: NEGATIVE
Ketones, ur: NEGATIVE mg/dL
Leukocytes,Ua: NEGATIVE
Nitrite: NEGATIVE
Protein, ur: NEGATIVE mg/dL
Specific Gravity, Urine: 1.023 (ref 1.005–1.030)
pH: 5.5 (ref 5.0–8.0)

## 2023-10-20 LAB — COMPREHENSIVE METABOLIC PANEL
ALT: 8 U/L (ref 0–44)
AST: 12 U/L — ABNORMAL LOW (ref 15–41)
Albumin: 4.5 g/dL (ref 3.5–5.0)
Alkaline Phosphatase: 78 U/L (ref 38–126)
Anion gap: 9 (ref 5–15)
BUN: 8 mg/dL (ref 6–20)
CO2: 25 mmol/L (ref 22–32)
Calcium: 9.3 mg/dL (ref 8.9–10.3)
Chloride: 105 mmol/L (ref 98–111)
Creatinine, Ser: 0.66 mg/dL (ref 0.44–1.00)
GFR, Estimated: >60 mL/min (ref >60)
Glucose, Bld: 103 mg/dL — ABNORMAL HIGH (ref 70–99)
Potassium: 3.9 mmol/L (ref 3.5–5.1)
Sodium: 139 mmol/L (ref 135–145)
Total Bilirubin: 0.7 mg/dL (ref 0.0–1.2)
Total Protein: 7.5 g/dL (ref 6.5–8.1)

## 2023-10-20 LAB — CBC
HCT: 37.7 % (ref 36.0–46.0)
Hemoglobin: 12.4 g/dL (ref 12.0–15.0)
MCH: 28.2 pg (ref 26.0–34.0)
MCHC: 32.9 g/dL (ref 30.0–36.0)
MCV: 85.9 fL (ref 80.0–100.0)
Platelets: 402 10*3/uL — ABNORMAL HIGH (ref 150–400)
RBC: 4.39 MIL/uL (ref 3.87–5.11)
RDW: 13 % (ref 11.5–15.5)
WBC: 5.4 10*3/uL (ref 4.0–10.5)
nRBC: 0 % (ref 0.0–0.2)

## 2023-10-20 LAB — LIPASE, BLOOD: Lipase: 10 U/L — ABNORMAL LOW (ref 11–51)

## 2023-10-20 LAB — PREGNANCY, URINE: Preg Test, Ur: NEGATIVE

## 2023-10-20 MED ORDER — IOHEXOL 300 MG/ML  SOLN
100.0000 mL | Freq: Once | INTRAMUSCULAR | Status: AC | PRN
  Administered 2023-10-20: 100 mL via INTRAVENOUS

## 2023-10-20 NOTE — ED Triage Notes (Signed)
LRQ pain past several days. Burning, bloating feelings. Last BM today. Occasional radiates lower to pelvis.

## 2023-10-20 NOTE — Discharge Instructions (Addendum)
You were evaluated in the emergency room for abdominal pain.  Your labs and CT scan did not show any acute abnormality.  If you have persistent symptoms please follow-up with your primary care doctor in the next 3 to 5 days.  If you experience any new or worsening symptoms including fevers, chills, persistent vomiting or diarrhea please return to the emergency room.

## 2023-10-20 NOTE — ED Provider Notes (Signed)
EMERGENCY DEPARTMENT AT [facility] Provider Note   CSN: [id number] Arrival date & time: 10/20/23  1533     History  Chief Complaint  Patient presents with   Abdominal Pain    Katrina Moss is a 48 y.o. female who presents with right lower quadrant abdominal pain.  She has noted the symptoms over the past few weeks but has markedly improved in the past couple days.  Denies any correlation with her..  Denies any vaginal bleeding or discharge.  No vomiting or diarrhea.  No correlation with eating food.  She does report history of kidney stones but states the symptoms feel different.   Abdominal Pain      Home Medications Prior to Admission medications   Medication Sig Start Date End Date Taking? Authorizing Provider  amLODipine (NORVASC) 5 MG tablet Take 1 tablet (5 mg total) by mouth daily. Take to help lower blood pressure. 09/27/23   Allwardt, Crist Infante, PA-C  Cholecalciferol (VITAMIN D3) 50 MCG (2000 UT) capsule Take 2,000 Units by mouth daily.    [provider]  Ferrous Sulfate (IRON PO) Take 1 tablet by mouth at bedtime.    [provider]  MAGNESIUM PO Take by mouth.    [provider]      Allergies    Pamelor [nortriptyline]    Review of Systems   Review of Systems  Gastrointestinal:  Positive for abdominal pain.    Physical Exam Updated Vital Signs BP (!) 154/106   Pulse 86   Temp 98.1 F (36.7 C)   Resp 18   Ht 5\' 2"  (1.575 m)   LMP 08/27/2023   SpO2 100%   BMI 41.92 kg/m  Physical Exam Vitals and nursing note reviewed.  Constitutional:      General: She is not in acute distress.    Appearance: She is well-developed.  HENT:     Head: Normocephalic and atraumatic.  Eyes:     Conjunctiva/sclera: Conjunctivae normal.  Cardiovascular:     Rate and Rhythm: Normal rate and regular rhythm.     Heart sounds: No murmur heard. Pulmonary:     Effort: Pulmonary effort is normal. No respiratory distress.      Breath sounds: Normal breath sounds.  Abdominal:     Palpations: Abdomen is soft.     Tenderness: There is abdominal tenderness in the right lower quadrant. There is no guarding or rebound.  Musculoskeletal:        General: No swelling.     Cervical back: Neck supple.  Skin:    General: Skin is warm and dry.     Capillary Refill: Capillary refill takes less than 2 seconds.  Neurological:     Mental Status: She is alert.  Psychiatric:        Mood and Affect: Mood normal.     ED Results / Procedures / Treatments   Labs (all labs ordered are listed, but only abnormal results are displayed) Labs Reviewed  LIPASE, BLOOD - Abnormal; Notable for the following components:      Result Value   Lipase 10 (*)    All other components within normal limits  COMPREHENSIVE METABOLIC PANEL - Abnormal; Notable for the following components:   Glucose, Bld 103 (*)    AST 12 (*)    All other components within normal limits  CBC - Abnormal; Notable for the following components:   Platelets 402 (*)    All other components within normal limits  URINALYSIS, ROUTINE  W REFLEX MICROSCOPIC  PREGNANCY, URINE    EKG None  Radiology CT ABDOMEN PELVIS W CONTRAST Result Date: 10/20/2023 CLINICAL DATA:  Right lower quadrant pain EXAM: CT ABDOMEN AND PELVIS WITH CONTRAST TECHNIQUE: Multidetector CT imaging of the abdomen and pelvis was performed using the standard protocol following bolus administration of intravenous contrast. RADIATION DOSE REDUCTION: This exam was performed according to the departmental dose-optimization program which includes automated exposure control, adjustment of the mA and/or kV according to patient size and/or use of iterative reconstruction technique. CONTRAST:  OMNIPAQUE IOHEXOL 300 MG/ML  SOLN COMPARISON:  None Available. FINDINGS: Lower chest: No acute abnormality Hepatobiliary: No focal hepatic abnormality. Gallbladder unremarkable. Pancreas: No focal abnormality or ductal  dilatation. Spleen: No focal abnormality.  Normal size. Adrenals/Urinary Tract: No adrenal abnormality. No focal renal abnormality. No stones or hydronephrosis. Urinary bladder is unremarkable. Stomach/Bowel: Normal appendix. Stomach, large and small bowel grossly unremarkable. Vascular/Lymphatic: No evidence of aneurysm or adenopathy. Reproductive: Uterus and adnexa unremarkable.  No mass. Other: No free fluid or free air. Musculoskeletal: No acute bony abnormality. IMPRESSION: Normal appendix. No acute findings in the abdomen or pelvis. Electronically Signed   By: Charlett Nose M.D.   On: 10/20/2023 20:12    Procedures Procedures    Medications Ordered in ED Medications  iohexol (OMNIPAQUE) 300 MG/ML solution 100 mL (100 mLs Intravenous Contrast Given 10/20/23 2005)    ED Course/ Medical Decision Making/ A&P                                 Medical Decision Making Amount and/or Complexity of Data Reviewed Labs: ordered. Radiology: ordered.  Risk Prescription drug management.   This patient presents to the ED with chief complaint(s) of abdominal pain.  The complaint involves an extensive differential diagnosis and also carries with it a high risk of complications and morbidity.   pertinent past medical history as listed in HPI  The differential diagnosis includes  Appendicitis, diverticulitis, cholecystitis, gastroenteritis, fibroids, ovarian cyst, torsion, UTI, nephrolithiasis, pyelonephritis The initial plan is to  Will obtain basic labs, CT abdomen pelvis Additional history obtained: No additional historians or records utilized  Initial Assessment:   Hemodynamically stable, afebrile patient presenting with right lower quadrant abdominal pain.  Does not appear to be correlated to her period.  Low suspicion for fibroids or ovarian pathology.  No urinary symptoms to suggest suggest UTI.  She reports she occasionally has flank pain, however states the symptoms feel markedly different  than prior kidney stones.  No vomiting or diarrhea notices just gastroenteritis.  Independent ECG interpretation:  None  Independent labs interpretation:  The following labs were independently interpreted:  CMP and CBC without significant abnormality, lipase within normal limits, UA unremarkable, hCG negative  Independent visualization and interpretation of imaging: I independently visualized the following imaging with scope of interpretation limited to determining acute life threatening conditions related to emergency care: CT abdomen pelvis, which revealed no acute abnormality  Treatment and Reassessment: No medications administered during visit Discussed negative workup with patient and discharge plan.  She is agreeable, offered to send something in for nausea she declines.  And states she does not feel that nauseous during that much pain.  Consultations obtained:   None  Disposition:   Patient will be discharged home.  Encouraged to follow with primary care doctor within the next 3 to 5 days. The patient has been appropriately medically screened and/or stabilized  in the ED. I have low suspicion for any other emergent medical condition which would require further screening, evaluation or treatment in the ED or require inpatient management. At time of discharge the patient is hemodynamically stable and in no acute distress. I have discussed work-up results and diagnosis with patient and answered all questions. Patient is agreeable with discharge plan. We discussed strict return precautions for returning to the emergency department and they verbalized understanding.     Social Determinants of Health:   none  This note was dictated with voice recognition software.  Despite best efforts at proofreading, errors may have occurred which can change the documentation meaning.          Final Clinical Impression(s) / ED Diagnoses Final diagnoses:  Right lower quadrant abdominal pain     Rx / DC Orders ED Discharge Orders     None         Fabienne Bruns 10/20/23 2156    Rondel Baton, MD 10/24/23 1250

## 2023-12-21 IMAGING — CR DG SHOULDER 2+V*R*
3 series · 3 of 3 positions shown · non-contrast
Comparison: None.

CLINICAL DATA: Right shoulder pain and limited range of motion.

EXAM:
RIGHT SHOULDER - 2+ VIEW

[w shoulder y-view right (1 of 2)]
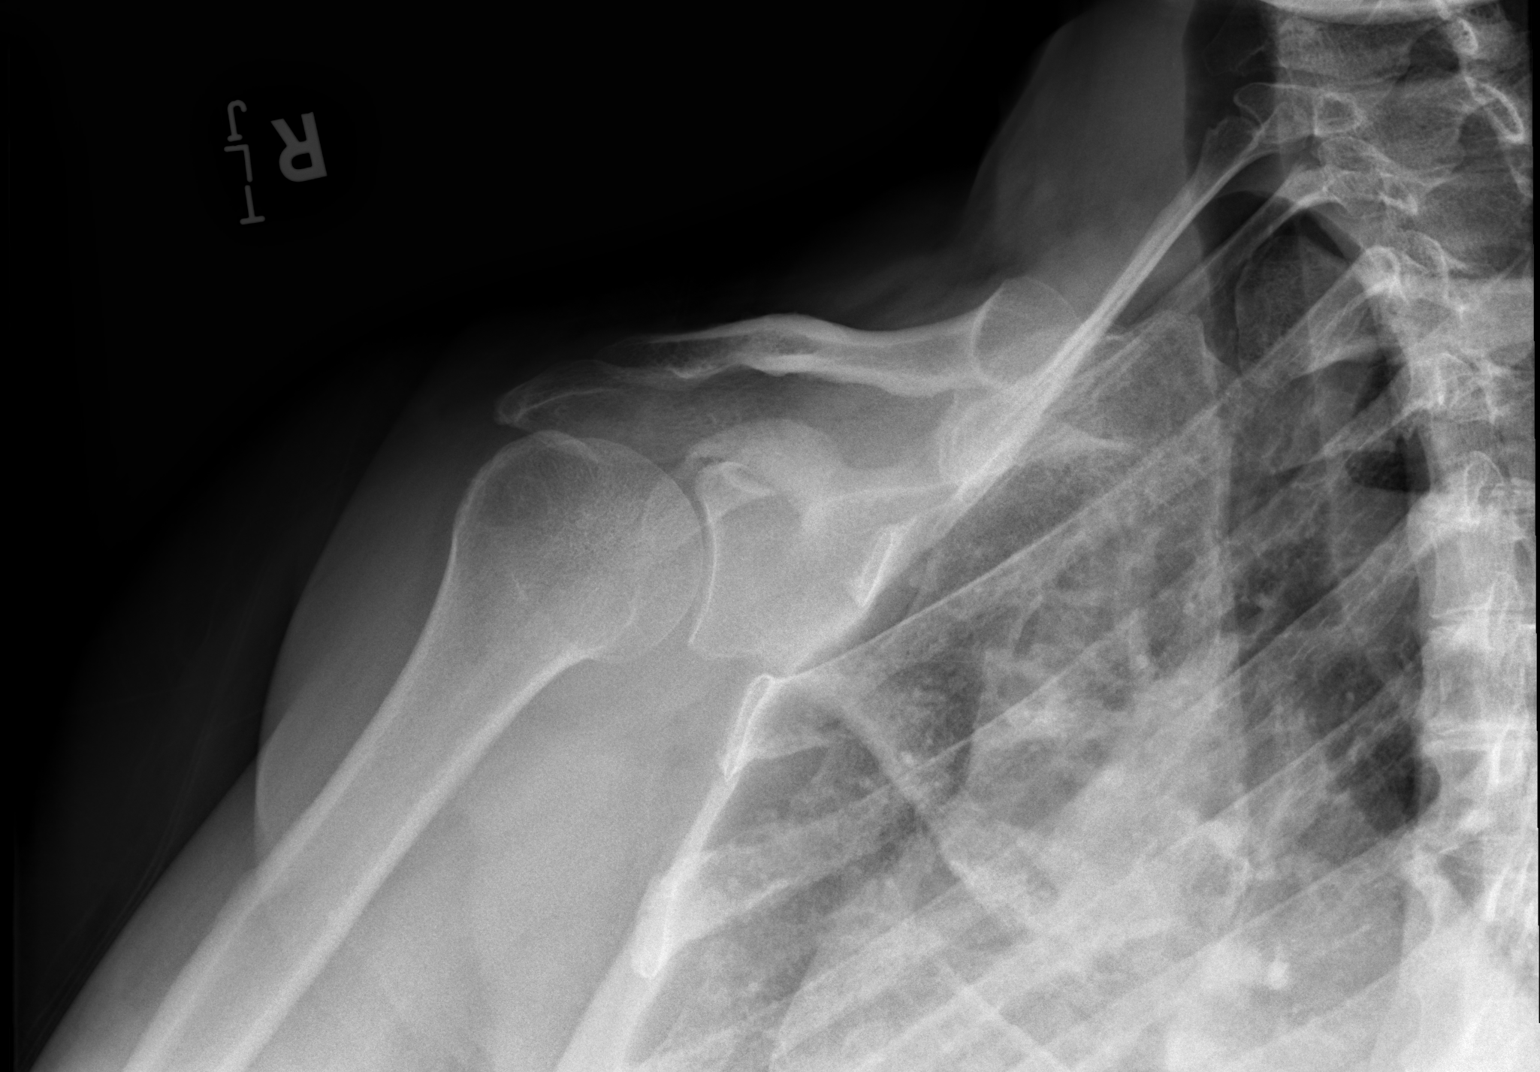

[w shoulder y-view right (2 of 2)]
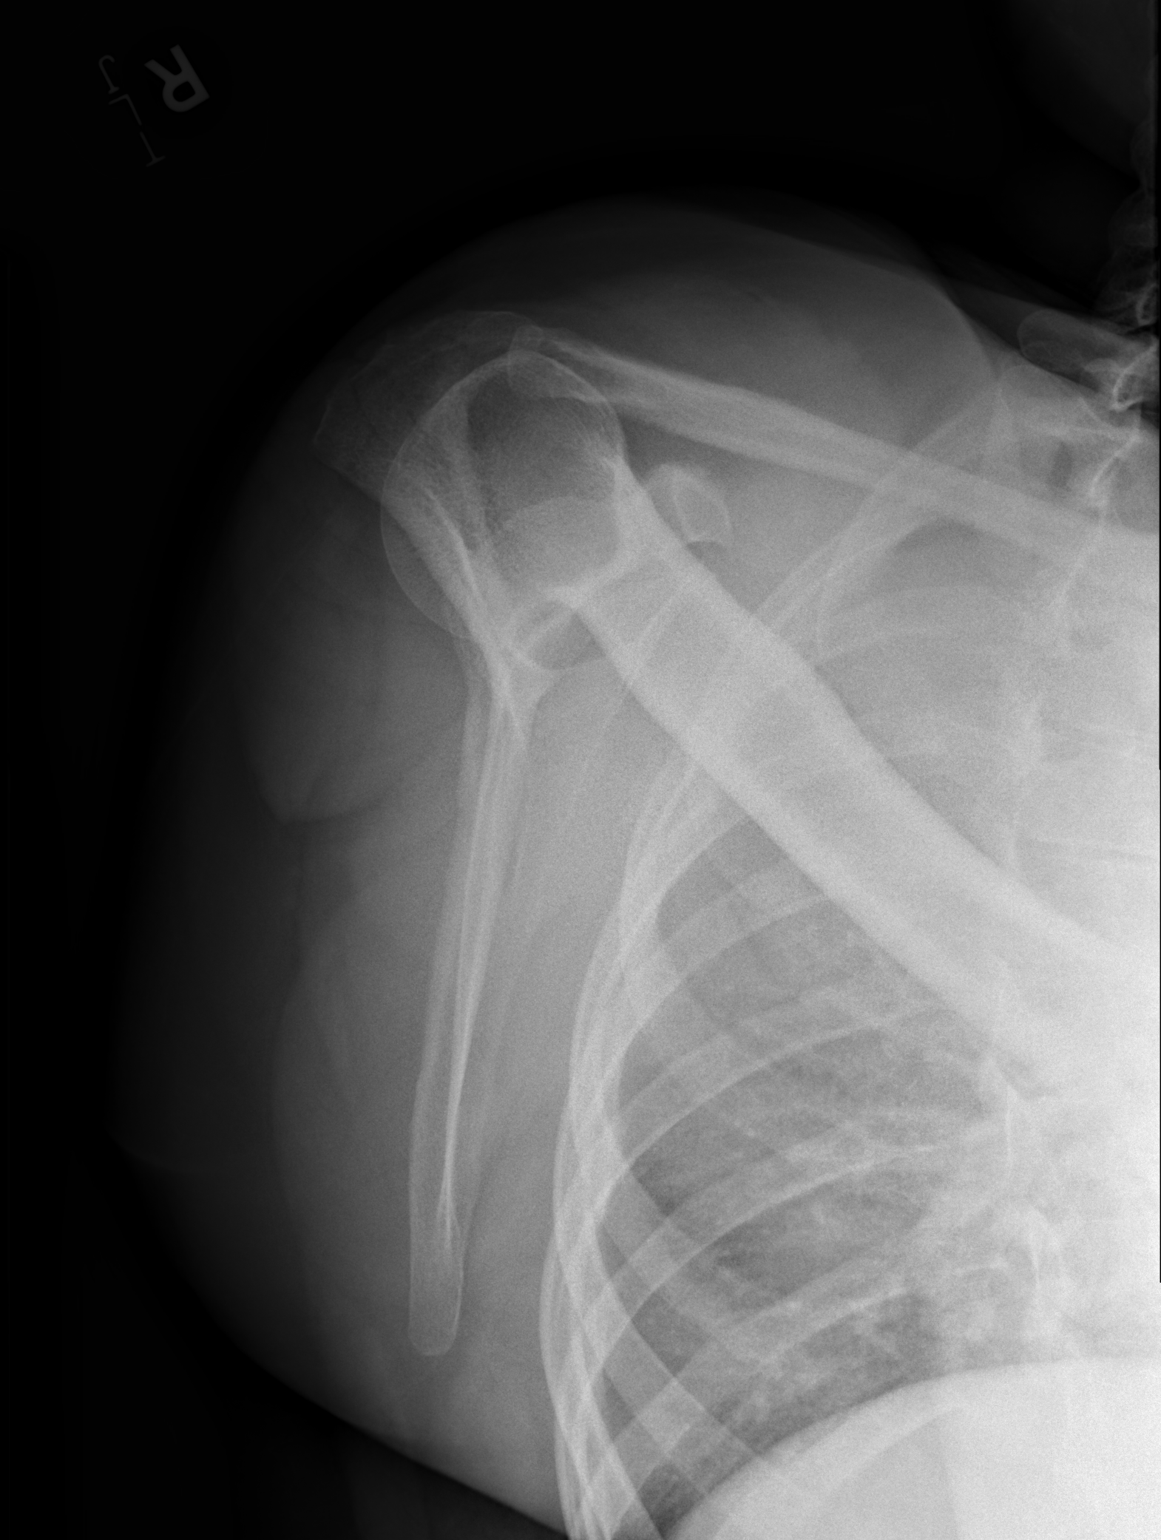

[w shoulder axillary right]
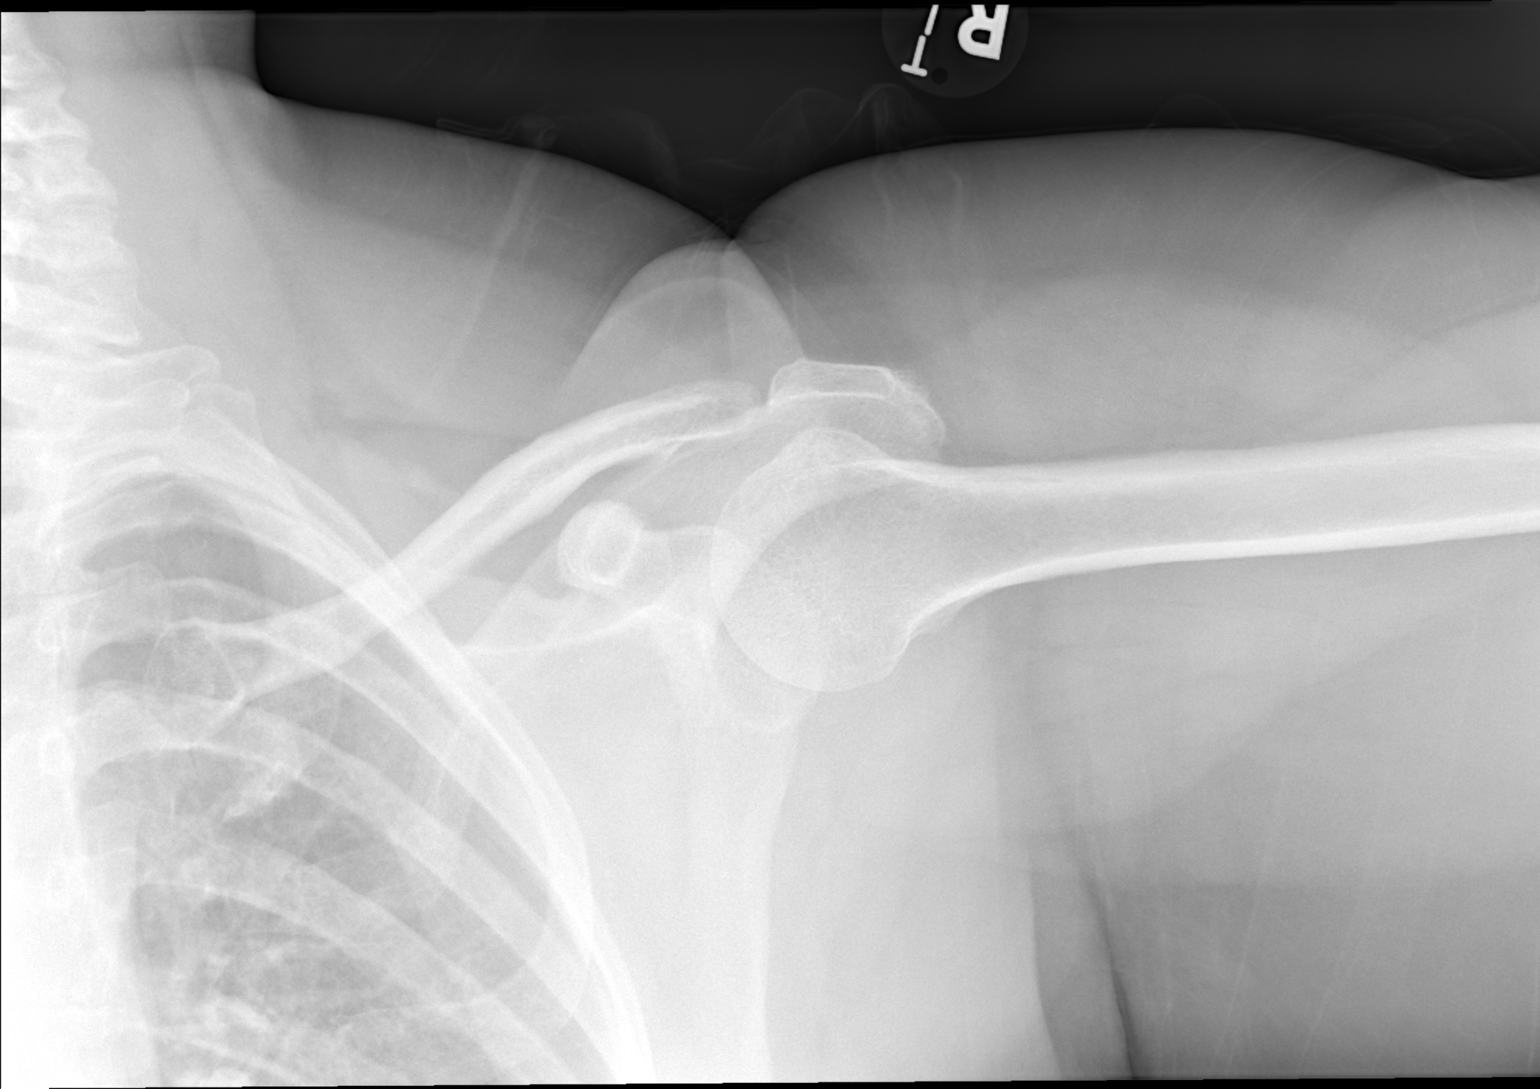

[3 of 3 positions shown; findings below may reference images not displayed]

FINDINGS: Normal bone mineralization. Normal alignment of the glenohumeral and
acromioclavicular joints. No significant osteoarthritis. No acute
fracture or dislocation. The visualized portion of the right lung is
unremarkable.
IMPRESSION: Normal right shoulder radiographs.

## 2024-04-04 ENCOUNTER — Emergency Department (HOSPITAL_BASED_OUTPATIENT_CLINIC_OR_DEPARTMENT_OTHER): Admitting: Radiology

## 2024-04-04 ENCOUNTER — Emergency Department (HOSPITAL_BASED_OUTPATIENT_CLINIC_OR_DEPARTMENT_OTHER)
Admission: EM | Admit: 2024-04-04 | Discharge: 2024-04-04 | Disposition: A | Discharge status: Home / Self Care | Attending: Emergency Medicine | Admitting: Emergency Medicine

## 2024-04-04 ENCOUNTER — Other Ambulatory Visit: Payer: Self-pay

## 2024-04-04 ENCOUNTER — Encounter (HOSPITAL_BASED_OUTPATIENT_CLINIC_OR_DEPARTMENT_OTHER): Payer: Self-pay

## 2024-04-04 DIAGNOSIS — R079 Chest pain, unspecified: Secondary | ICD-10-CM | POA: Diagnosis not present

## 2024-04-04 DIAGNOSIS — I10 Essential (primary) hypertension: Secondary | ICD-10-CM | POA: Insufficient documentation

## 2024-04-04 DIAGNOSIS — Z79899 Other long term (current) drug therapy: Secondary | ICD-10-CM | POA: Diagnosis not present

## 2024-04-04 DIAGNOSIS — R202 Paresthesia of skin: Secondary | ICD-10-CM | POA: Diagnosis not present

## 2024-04-04 DIAGNOSIS — I493 Ventricular premature depolarization: Secondary | ICD-10-CM | POA: Insufficient documentation

## 2024-04-04 DIAGNOSIS — R0789 Other chest pain: Secondary | ICD-10-CM | POA: Diagnosis not present

## 2024-04-04 LAB — BASIC METABOLIC PANEL WITH GFR
Anion gap: 12 (ref 5–15)
BUN: 10 mg/dL (ref 6–20)
CO2: 25 mmol/L (ref 22–32)
Calcium: 9.7 mg/dL (ref 8.9–10.3)
Chloride: 103 mmol/L (ref 98–111)
Creatinine, Ser: 0.7 mg/dL (ref 0.44–1.00)
GFR, Estimated: >60 mL/min (ref >60)
Glucose, Bld: 86 mg/dL (ref 70–99)
Potassium: 3.9 mmol/L (ref 3.5–5.1)
Sodium: 140 mmol/L (ref 135–145)

## 2024-04-04 LAB — TROPONIN T, HIGH SENSITIVITY
Troponin T High Sensitivity: <15 ng/L (ref <19)
Troponin T High Sensitivity: <15 ng/L (ref <19)

## 2024-04-04 LAB — CBC
HCT: 40.6 % (ref 36.0–46.0)
Hemoglobin: 13.4 g/dL (ref 12.0–15.0)
MCH: 28.2 pg (ref 26.0–34.0)
MCHC: 33 g/dL (ref 30.0–36.0)
MCV: 85.5 fL (ref 80.0–100.0)
Platelets: 399 K/uL (ref 150–400)
RBC: 4.75 MIL/uL (ref 3.87–5.11)
RDW: 13.1 % (ref 11.5–15.5)
WBC: 4.9 K/uL (ref 4.0–10.5)
nRBC: 0 % (ref 0.0–0.2)

## 2024-04-04 LAB — PREGNANCY, URINE: Preg Test, Ur: NEGATIVE

## 2024-04-04 NOTE — Discharge Instructions (Signed)
 Your workup today is reassuring. It is very unlikely that your pain is due to an issue in your heart.  Your cardiac enzyme (troponin) was normal today. Your EKG which is a measure of the heart's electrical activity and rhythm is normal today aside from some occasional extra beats (PVC's or premature ventricular contractions). These would both show abnormalities if you were having a heart attack.  Your chest x-ray is normal today.  Your blood counts and electrolytes are normal today.  Please follow-up with your PCP within the next week for further evaluation and management of your symptoms.  Return to the ER if you have any shortness of breath, difficulty breathing, worsening chest pain, dizziness, jaw pain, left arm or shoulder pain, abdominal pain, unexplained fever, any other new or concerning symptoms.

## 2024-04-04 NOTE — ED Triage Notes (Signed)
 Pt reports chest pain x2 days feeling like tingling in arm and hand, and a fluttering feeling in chest. Pt denies any SOB, N/V.

## 2024-04-04 NOTE — ED Provider Notes (Signed)
 [place] EMERGENCY DEPARTMENT AT [facility] Provider Note   CSN: [id number] Arrival date & time: 04/04/24  1333     Patient presents with: Chest Pain   Katrina Moss is a 48 y.o. female with history of hypertension, anemia, presents with concern for pain in the center of her chest that has been ongoing for the past 2 days.  She denies any pain with exertion or any pleuritic pain.  Denies any pain that radiates to the back.  Today she noticed some feelings of heart racing when walking and also some left arm and jaw pain.  Denies any abdominal pain, nausea or vomiting.  Denies any shortness of breath.  Denies any lower extremity pain or swelling, recent long plane or car rides, recent hospitalizations or surgeries, any hormonal medication use, or any history of a blood clot.    Chest Pain      Prior to Admission medications   Medication Sig Start Date End Date Taking? Authorizing Provider  amLODipine  (NORVASC ) 5 MG tablet Take 1 tablet (5 mg total) by mouth daily. Take to help lower blood pressure. 09/27/23   Allwardt, Mardy HERO, PA-C  Cholecalciferol (VITAMIN D3) 50 MCG (2000 UT) capsule Take 2,000 Units by mouth daily.    [provider]  Ferrous Sulfate (IRON PO) Take 1 tablet by mouth at bedtime.    [provider]  MAGNESIUM PO Take by mouth.    [provider]    Allergies: Pamelor [nortriptyline]    Review of Systems  Cardiovascular:  Positive for chest pain.    Updated Vital Signs BP (!) 140/94   Pulse (!) 59   Temp 98 F (36.7 C)   Resp 15   Ht 5' 2 (1.575 m)   Wt 102.1 kg   SpO2 100%   BMI 41.15 kg/m   Physical Exam Vitals and nursing note reviewed.  Constitutional:      General: She is not in acute distress.    Appearance: She is well-developed.  HENT:     Head: Normocephalic and atraumatic.  Eyes:     Conjunctiva/sclera: Conjunctivae normal.  Cardiovascular:     Rate and Rhythm: Normal rate and regular rhythm.      Heart sounds: No murmur heard. Pulmonary:     Effort: Pulmonary effort is normal. No respiratory distress.     Breath sounds: Normal breath sounds.  Abdominal:     Palpations: Abdomen is soft.     Tenderness: There is no abdominal tenderness.  Musculoskeletal:        General: No swelling.     Cervical back: Neck supple.     Comments: No lower extremity edema or skin changes.  No calf tenderness to palpation bilaterally  Skin:    General: Skin is warm and dry.     Capillary Refill: Capillary refill takes less than 2 seconds.  Neurological:     Mental Status: She is alert.  Psychiatric:        Mood and Affect: Mood normal.     (all labs ordered are listed, but only abnormal results are displayed) Labs Reviewed  BASIC METABOLIC PANEL WITH GFR  CBC  PREGNANCY, URINE  TROPONIN T, HIGH SENSITIVITY  TROPONIN T, HIGH SENSITIVITY    EKG: EKG Interpretation Date/Time:  Friday April 04 2024 13:40:25 EDT Ventricular Rate:  93 PR Interval:  162 QRS Duration:  82 QT Interval:  334 QTC Calculation: 415 R Axis:   14  Text Interpretation: Sinus rhythm with occasional  Premature ventricular complexes Anteroseptal infarct , age undetermined T wave abnormality, consider inferolateral ischemia Abnormal ECG No previous ECGs available Confirmed by Pamella Sharper [phone]) on 04/04/2024 2:39:41 PM  Radiology: DG Chest 2 View Result Date: 04/04/2024 CLINICAL DATA:  Chest pain for 2 days. Tingling in the arm and hand. EXAM: CHEST - 2 VIEW COMPARISON:  None Available. FINDINGS: The heart size and mediastinal contours are normal. The lungs are clear. There is no pleural effusion or pneumothorax. No acute osseous findings are identified. Mild degenerative changes in the spine. IMPRESSION: No evidence of acute cardiopulmonary process. Electronically Signed   By: Elsie Perone M.D.   On: 04/04/2024 15:04     Procedures   Medications Ordered in the ED - No data to display              HEART  Score: 2                    Medical Decision Making Amount and/or Complexity of Data Reviewed Labs: ordered. Radiology: ordered.     Differential diagnosis includes but is not limited to ACS, arrhythmia, aortic aneurysm, pericarditis, myocarditis, pericardial effusion, cardiac tamponade, musculoskeletal pain, GERD, Boerhaave's syndrome, DVT/PE, pneumonia, pleural effusion   ED Course:  Upon initial evaluation, patient well-appearing, no acute distress.  Stable vitals aside from her elevated blood pressure upon arrival at 150 11/105.   Labs Ordered: I Ordered, and personally interpreted labs.  The pertinent results include:   Initial and repeat troponin under 15 BMP and CBC within normal limits Pregnancy negative  Imaging Studies ordered: I ordered imaging studies including chest x-ray I independently visualized the imaging with scope of interpretation limited to determining acute life threatening conditions related to emergency care. Imaging showed no acute abnormalities I agree with the radiologist interpretation   Cardiac Monitoring: / EKG: The patient was maintained on a cardiac monitor.  I personally viewed and interpreted the cardiac monitored which showed an underlying rhythm of: Normal sinus rhythm with occasional PVCs.  T wave inversions in the inferior leads.  No ST elevations   Medications Given: None  Upon re-evaluation, patient remains well-appearing with stable vitals.   Low concern for ACS at this time given troponin remains stable with initial troponin of under 15 and repeat of under 15, pain non-exertional, and EKG with normal sinus rhythm and no ST elevations.  She does have some PVCs on her EKG which would explain the feeling of heart palpitations she is having.  This could be causing some of her chest discomfort.  HEART score of 2 due to age and HTN. Chest x-ray without any acute abnormality. No concern for DVT or PE at this time given PERC negative.      Impression: PVC's  Atypical chest pain  Disposition:  The patient was discharged home with instructions to follow-up with her PCP within the next week for further evaluation.  She declines any medicine for acid reflux at this time. Return precautions given.      This chart was dictated using voice recognition software, Dragon. Despite the best efforts of this provider to proofread and correct errors, errors may still occur which can change documentation meaning.       Final diagnoses:  Premature ventricular contraction on electrocardiogram  Chest pain, unspecified type    ED Discharge Orders     None          Veta Palma, PA-C 04/04/24 1648    Geraldene Hamilton, MD 04/04/24 [phone]

## 2024-06-16 ENCOUNTER — Encounter: Admitting: Family Medicine

## 2024-07-09 ENCOUNTER — Encounter: Payer: Self-pay | Admitting: Family Medicine

## 2024-07-09 ENCOUNTER — Ambulatory Visit: Admitting: Family Medicine

## 2024-07-09 VITALS — BP 136/80 | HR 95 | Temp 98.3°F | Ht 62.0 in | Wt 229.0 lb

## 2024-07-09 DIAGNOSIS — Z13220 Encounter for screening for lipoid disorders: Secondary | ICD-10-CM

## 2024-07-09 DIAGNOSIS — E66813 Obesity, class 3: Secondary | ICD-10-CM | POA: Diagnosis not present

## 2024-07-09 DIAGNOSIS — E559 Vitamin D deficiency, unspecified: Secondary | ICD-10-CM | POA: Diagnosis not present

## 2024-07-09 DIAGNOSIS — Z0000 Encounter for general adult medical examination without abnormal findings: Secondary | ICD-10-CM | POA: Diagnosis not present

## 2024-07-09 DIAGNOSIS — R7303 Prediabetes: Secondary | ICD-10-CM | POA: Diagnosis not present

## 2024-07-09 DIAGNOSIS — D500 Iron deficiency anemia secondary to blood loss (chronic): Secondary | ICD-10-CM

## 2024-07-09 DIAGNOSIS — Z6841 Body Mass Index (BMI) 40.0 and over, adult: Secondary | ICD-10-CM

## 2024-07-09 DIAGNOSIS — I10 Essential (primary) hypertension: Secondary | ICD-10-CM

## 2024-07-09 DIAGNOSIS — Z1231 Encounter for screening mammogram for malignant neoplasm of breast: Secondary | ICD-10-CM

## 2024-07-09 LAB — COMPREHENSIVE METABOLIC PANEL WITH GFR
ALT: 12 U/L (ref 0–35)
AST: 13 U/L (ref 0–37)
Albumin: 4.4 g/dL (ref 3.5–5.2)
Alkaline Phosphatase: 66 U/L (ref 39–117)
BUN: 8 mg/dL (ref 6–23)
CO2: 23 meq/L (ref 19–32)
Calcium: 9.1 mg/dL (ref 8.4–10.5)
Chloride: 107 meq/L (ref 96–112)
Creatinine, Ser: 0.6 mg/dL (ref 0.40–1.20)
GFR: 107.12 mL/min (ref >60.00)
Glucose, Bld: 99 mg/dL (ref 70–99)
Potassium: 3.8 meq/L (ref 3.5–5.1)
Sodium: 138 meq/L (ref 135–145)
Total Bilirubin: 0.7 mg/dL (ref 0.2–1.2)
Total Protein: 7.6 g/dL (ref 6.0–8.3)

## 2024-07-09 LAB — CBC WITH DIFFERENTIAL/PLATELET
Basophils Absolute: 0 K/uL (ref 0.0–0.1)
Basophils Relative: 0.5 % (ref 0.0–3.0)
Eosinophils Absolute: 0 K/uL (ref 0.0–0.7)
Eosinophils Relative: 0.5 % (ref 0.0–5.0)
HCT: 37.5 % (ref 36.0–46.0)
Hemoglobin: 12.2 g/dL (ref 12.0–15.0)
Lymphocytes Relative: 34.9 % (ref 12.0–46.0)
Lymphs Abs: 1.7 K/uL (ref 0.7–4.0)
MCHC: 32.7 g/dL (ref 30.0–36.0)
MCV: 86 fl (ref 78.0–100.0)
Monocytes Absolute: 0.4 K/uL (ref 0.1–1.0)
Monocytes Relative: 8.2 % (ref 3.0–12.0)
Neutro Abs: 2.7 K/uL (ref 1.4–7.7)
Neutrophils Relative %: 55.9 % (ref 43.0–77.0)
Platelets: 360 K/uL (ref 150.0–400.0)
RBC: 4.36 Mil/uL (ref 3.87–5.11)
RDW: 13.6 % (ref 11.5–15.5)
WBC: 4.8 K/uL (ref 4.0–10.5)

## 2024-07-09 LAB — LIPID PANEL
Cholesterol: 172 mg/dL (ref 0–200)
HDL: 51.6 mg/dL (ref >39.00)
LDL Cholesterol: 107 mg/dL — ABNORMAL HIGH (ref 0–99)
NonHDL: 120.69
Total CHOL/HDL Ratio: 3
Triglycerides: 66 mg/dL (ref 0.0–149.0)
VLDL: 13.2 mg/dL (ref 0.0–40.0)

## 2024-07-09 LAB — MAGNESIUM: Magnesium: 1.9 mg/dL (ref 1.5–2.5)

## 2024-07-09 LAB — HEMOGLOBIN A1C: Hgb A1c MFr Bld: 6 % (ref 4.6–6.5)

## 2024-07-09 LAB — VITAMIN D 25 HYDROXY (VIT D DEFICIENCY, FRACTURES): VITD: 11.89 ng/mL — ABNORMAL LOW (ref 30.00–100.00)

## 2024-07-09 LAB — TSH: TSH: 1.23 u[IU]/mL (ref 0.35–5.50)

## 2024-07-09 NOTE — Patient Instructions (Signed)
-  It was a pleasure to care for you today. -Physical exam completed today.  -Please schedule an appointment with GYN. You are do for a pap smear. -Ordered mammogram. -Ordered labs. Office will call with lab results and will be available via MyChart.  -Follow up in 2-4 weeks for additional complaints.

## 2024-07-09 NOTE — Progress Notes (Signed)
 Complete physical exam  Patient: Katrina Moss   DOB: June 30, 1976   48 y.o. Female  MRN: [id number]  Subjective:    Chief Complaint  Patient presents with   Annual Exam    Katrina Moss is a 48 y.o. female who presents today for a complete physical exam. She reports consuming a general diet. Exercising: Walking 2 times a week, 8,000 steps.  She generally feels fairly well. She reports sleeping fairly well. She does not have additional problems to discuss today.    Most recent fall risk assessment:    07/09/2024   10:59 AM  Fall Risk   Falls in the past year? 0  Number falls in past yr: 0  Injury with Fall? 0  Risk for fall due to : No Fall Risks  Follow up Falls evaluation completed     Most recent depression screenings:    07/09/2024   11:00 AM 09/27/2023    2:33 PM  PHQ 2/9 Scores  PHQ - 2 Score 0 0  PHQ- 9 Score 0     Vision:Within last year and Dental: No current dental problems and Receives regular dental care  Past Medical History:  Diagnosis Date   Anemia    Hypertension    Vitamin D deficiency    No past surgical history on file. Social History   Tobacco Use   Smoking status: Never    Passive exposure: Never   Smokeless tobacco: Never  Vaping Use   Vaping status: Never Used  Substance Use Topics   Alcohol use: Not Currently    Comment: 1x every 19month   Drug use: Never   Social History   Socioeconomic History   Marital status: Married    Spouse name: Not on file   Number of children: 4   Years of education: Not on file   Highest education level: Master's degree (e.g., MA, MS, MEng, MEd, MSW, MBA)  Occupational History   Occupation: Home maker  Tobacco Use   Smoking status: Never    Passive exposure: Never   Smokeless tobacco: Never  Vaping Use   Vaping status: Never Used  Substance and Sexual Activity   Alcohol use: Not Currently    Comment: 1x every 19month   Drug use: Never   Sexual activity: Yes    Partners: Male     Comment: partner vasectomy, menarche 48yo, sexual debut 48yo  Other Topics Concern   Not on file  Social History Narrative   Not on file   Social Drivers of Health   Financial Resource Strain: Low Risk  (03/02/2023)   Overall Financial Resource Strain (CARDIA)    Difficulty of Paying Living Expenses: Not hard at all  Food Insecurity: No Food Insecurity (03/01/2023)   Hunger Vital Sign    Worried About Running Out of Food in the Last Year: Never true    Ran Out of Food in the Last Year: Never true  Transportation Needs: No Transportation Needs (03/01/2023)   PRAPARE - Administrator, Civil Service (Medical): No    Lack of Transportation (Non-Medical): No  Physical Activity: Insufficiently Active (03/02/2023)   Exercise Vital Sign    Days of Exercise per Week: 3 days    Minutes of Exercise per Session: 30 min  Stress: No Stress Concern Present (03/02/2023)   [employer] of Occupational Health - Occupational Stress Questionnaire    Feeling of Stress : Only a little  Social Connections: Socially Integrated (03/01/2023)   Social Connection and  Isolation Panel    Frequency of Communication with Friends and Family: Three times a week    Frequency of Social Gatherings with Friends and Family: Twice a week    Attends Religious Services: More than 4 times per year    Active Member of [employer] or Organizations: Yes    Attends Engineer, structural: More than 4 times per year    Marital Status: Married  Catering manager Violence: Not At Risk (03/01/2023)   Humiliation, Afraid, Rape, and Kick questionnaire    Fear of Current or Ex-Partner: No    Emotionally Abused: No    Physically Abused: No    Sexually Abused: No   Family Status  Relation Name Status   Mother  Deceased   Father  Alive   Sister  Alive   Brother  Deceased       death by suicide 09-20-2023  Brother  Alive   MGM  Deceased   MGF  Deceased   PGM  Deceased   PGF  Deceased   Son  Deceased   Son   Alive   Son  Alive   Neg Hx  (Not Specified)  No partnership data on file   Family History  Problem Relation Age of Onset   Breast cancer Mother        over 78 at onset per patient   Hypertension Mother    Hypertension Father    Diabetes Father    Stroke Maternal Grandmother    Glaucoma Paternal Grandmother    Kidney disease Paternal Grandfather    Colon polyps Neg Hx    Colon cancer Neg Hx    Esophageal cancer Neg Hx    Rectal cancer Neg Hx    Stomach cancer Neg Hx    Allergies  Allergen Reactions   Pamelor [Nortriptyline] Other (See Comments)    Stuttering, elevated BP    Patient Care Team: Billy Philippe SAUNDERS, NP as PCP - General (Family Medicine) Chrzanowski, Jami B, NP as Nurse Practitioner (Obstetrics and Gynecology)   Outpatient Medications Prior to Visit  Medication Sig   Cholecalciferol (VITAMIN D3) 50 MCG (2000 UT) capsule Take 2,000 Units by mouth daily.   MAGNESIUM PO Take by mouth.   [DISCONTINUED] Ferrous Sulfate (IRON PO) Take 1 tablet by mouth at bedtime.   [DISCONTINUED] amLODipine  (NORVASC ) 5 MG tablet Take 1 tablet (5 mg total) by mouth daily. Take to help lower blood pressure. (Patient not taking: Reported on 07/09/2024)   No facility-administered medications prior to visit.    Review of Systems  Musculoskeletal:  Positive for back pain and joint pain.   See HPI above    Objective:   BP 136/80   Pulse 95   Temp 98.3 F (36.8 C) (Oral)   Ht 5' 2 (1.575 m)   Wt 229 lb (103.9 kg)   SpO2 98%   BMI 41.88 kg/m    Physical Exam Vitals reviewed.  Constitutional:      General: She is not in acute distress.    Appearance: Normal appearance. She is obese. She is not ill-appearing or toxic-appearing.  HENT:     Head: Normocephalic and atraumatic.     Right Ear: Tympanic membrane, ear canal and external ear normal. There is no impacted cerumen.     Left Ear: Tympanic membrane, ear canal and external ear normal. There is no impacted cerumen.      Nose:     Right Sinus: No maxillary sinus tenderness or frontal  sinus tenderness.     Left Sinus: No maxillary sinus tenderness or frontal sinus tenderness.     Mouth/Throat:     Mouth: Mucous membranes are moist.     Pharynx: Oropharynx is clear. Uvula midline. No pharyngeal swelling, oropharyngeal exudate, posterior oropharyngeal erythema or uvula swelling.  Eyes:     General:        Right eye: No discharge.        Left eye: No discharge.     Conjunctiva/sclera: Conjunctivae normal.     Pupils: Pupils are equal, round, and reactive to light.  Neck:     Thyroid : No thyromegaly.  Cardiovascular:     Rate and Rhythm: Normal rate and regular rhythm.     Pulses:          Posterior tibial pulses are 2+ on the right side and 2+ on the left side.     Heart sounds: Normal heart sounds. No murmur heard.    No friction rub. No gallop.  Pulmonary:     Effort: Pulmonary effort is normal. No respiratory distress.     Breath sounds: Normal breath sounds.  Abdominal:     General: Abdomen is flat. Bowel sounds are normal. There is no distension.     Palpations: Abdomen is soft. There is no mass.     Tenderness: There is no abdominal tenderness.  Musculoskeletal:        General: Normal range of motion.     Cervical back: Normal range of motion.     Right lower leg: No edema.     Left lower leg: No edema.  Lymphadenopathy:     Cervical: No cervical adenopathy.  Skin:    General: Skin is warm and dry.  Neurological:     General: No focal deficit present.     Mental Status: She is alert and oriented to person, place, and time. Mental status is at baseline.     Motor: No weakness.     Gait: Gait normal.  Psychiatric:        Mood and Affect: Mood normal.        Behavior: Behavior normal.        Thought Content: Thought content normal.        Judgment: Judgment normal.        Assessment & Plan:    Routine Health Maintenance and Physical Exam   There is no immunization history on  file for this patient.  Health Maintenance  Topic Date Due   COVID-19 Vaccine (1) Never done   DTaP/Tdap/Td (1 - Tdap) Never done   Hepatitis B Vaccines 19-59 Average Risk (1 of 3 - 19+ 3-dose series) Never done   Cervical Cancer Screening (HPV/Pap Cotest)  Never done   Mammogram  Never done   Influenza Vaccine  Never done   Colonoscopy  05/06/2033   Hepatitis C Screening  Completed   HIV Screening  Completed   Pneumococcal Vaccine  Aged Out   HPV VACCINES  Aged Out   Meningococcal B Vaccine  Aged Out    Discussed health benefits of physical activity, and encouraged her to engage in regular exercise appropriate for her age and condition.  Annual physical exam  Encounter for screening mammogram for malignant neoplasm of breast -     3D Screening Mammogram, Left and Right; Future  Essential hypertension  Lipid screening -     Lipid panel  Prediabetes -     Comprehensive metabolic panel with GFR -  Hemoglobin A1c  Class 3 severe obesity due to excess calories with serious comorbidity and body mass index (BMI) of 40.0 to 44.9 in adult (HCC) -     CBC with Differential/Platelet -     Lipid panel -     Comprehensive metabolic panel with GFR -     Hemoglobin A1c -     TSH -     Magnesium  Iron deficiency anemia due to chronic blood loss -     CBC with Differential/Platelet -     Iron, TIBC and Ferritin Panel  Vitamin D deficiency -     VITAMIN D 25 Hydroxy (Vit-D Deficiency, Fractures)  1.Review health maintenance:  -Cervical cancer screening: Schedule an appointment with GYN  -Covid booster: Declines  -Tdap vaccine: Local pharmacy  -Hep B vaccine: Unknown  -Influenza vaccine: Declines  -Mammogram: Ordered  2.Physical exam completed today.  3.Ordered labs. Office will call with lab results and will be available via MyChart.  4.Follow up in 2-4 weeks for additional complaints.  Return for follow-up; 2-4 weeks .     Britni Driscoll, NP

## 2024-07-10 ENCOUNTER — Ambulatory Visit: Payer: Self-pay | Admitting: Family Medicine

## 2024-07-10 DIAGNOSIS — E559 Vitamin D deficiency, unspecified: Secondary | ICD-10-CM

## 2024-07-10 LAB — IRON,TIBC AND FERRITIN PANEL
%SAT: 19 % (ref 16–45)
Ferritin: 24 ng/mL (ref 16–232)
Iron: 70 ug/dL (ref 40–190)
TIBC: 372 ug/dL (ref 250–450)

## 2024-07-10 MED ORDER — VITAMIN D3 1.25 MG (50000 UT) PO CAPS: 1.2500 mg | ORAL_CAPSULE | ORAL | 0 refills | Status: AC

## 2024-07-25 ENCOUNTER — Ambulatory Visit

## 2024-07-29 ENCOUNTER — Encounter: Payer: Self-pay | Admitting: Family Medicine

## 2024-07-29 ENCOUNTER — Ambulatory Visit: Admitting: Family Medicine

## 2024-07-29 VITALS — BP 130/76 | HR 76 | Temp 97.8°F | Ht 62.0 in | Wt 228.0 lb

## 2024-07-29 DIAGNOSIS — M25562 Pain in left knee: Secondary | ICD-10-CM | POA: Diagnosis not present

## 2024-07-29 DIAGNOSIS — G8929 Other chronic pain: Secondary | ICD-10-CM | POA: Diagnosis not present

## 2024-07-29 DIAGNOSIS — M79674 Pain in right toe(s): Secondary | ICD-10-CM

## 2024-07-29 NOTE — Progress Notes (Signed)
 Established Patient Office Visit   Subjective:  Patient ID: Katrina Moss, female    DOB: [birth year]  Age: 48 y.o. MRN: [id number]  Chief Complaint  Patient presents with   Medical Management of Chronic Issues    2 week follow up    HPI Patient is complaining of left knee pain and right second pain with decrease ROM.   Left knee pain started 6-7 months ago. Gradually pain, but hurts mostly at night time. Described as sharp pain. Daily, mostly at night time, wakes her up at night time. Pin point pain, not radiating pain. Denies numbness or tingling. Denies recent injury. Seen [facility] in [place] about 2 years ago for the same knee pain. She was provided a steroid injection with some relief. She had an x-ray completed.   Second right toe pain with decrease ROM started over the summer. Gradually occurred. Denies any injury to the toe. Pain with a lot of ambulation or with manipulation. Described as pressure. No radiating pain. Denies numbness and tingling. Not took any medication for pain.   ROS See HPI above     Objective:   BP 130/76   Pulse 76   Temp 97.8 F (36.6 C) (Oral)   Ht 5' 2 (1.575 m)   Wt 228 lb (103.4 kg)   SpO2 99%   BMI 41.70 kg/m    Physical Exam Vitals reviewed.  Constitutional:      General: She is not in acute distress.    Appearance: Normal appearance. She is obese. She is not ill-appearing, toxic-appearing or diaphoretic.  HENT:     Head: Normocephalic and atraumatic.  Eyes:     General:        Right eye: No discharge.        Left eye: No discharge.     Conjunctiva/sclera: Conjunctivae normal.  Cardiovascular:     Rate and Rhythm: Normal rate and regular rhythm.     Heart sounds: Normal heart sounds. No murmur heard.    No friction rub. No gallop.  Pulmonary:     Effort: Pulmonary effort is normal. No respiratory distress.     Breath sounds: Normal breath sounds.  Musculoskeletal:        General: Normal range of motion.     Right  foot: Normal pulse.     Comments: Right second toe: Pin point, circular area of swelling-anterior of second right toe. Tenderness. Decreased ROM due to pain.   Skin:    General: Skin is warm and dry.  Neurological:     General: No focal deficit present.     Mental Status: She is alert and oriented to person, place, and time. Mental status is at baseline.  Psychiatric:        Mood and Affect: Mood normal.        Behavior: Behavior normal.        Thought Content: Thought content normal.        Judgment: Judgment normal.      Assessment & Plan:  Chronic pain of left knee -     Ambulatory referral to Orthopedic Surgery  Toe pain, chronic, right -     DG Foot Complete Right; Future  -Placed a referral back to [facility] in [place] for chronic left knee pain. Please call the office or send a MyChart message if you do not receive a phone call or a MyChart message about appointment in 2 weeks.  -Ordered right foot x-ray for chronic toe pain to the  second toe. May either make a x-ray appointment for this location or may go to the following location for x-ray. Office will call with lab results and will be available via MyChart.   [facility] or xray: Walk in 8:30-4:30 during weekdays, no appointment needed [address]  [place], [place] [address]   Jamiya Nims, NP

## 2024-07-29 NOTE — Patient Instructions (Signed)
-  Placed a referral back to [facility] in [place] for chronic left knee pain. Please call the office or send a MyChart message if you do not receive a phone call or a MyChart message about appointment in 2 weeks.  -Ordered right foot x-ray for chronic toe pain to the second toe. May either make a x-ray appointment for this location or may go to the following location for x-ray. Office will call with lab results and will be available via MyChart.  [facility] or xray: Walk in 8:30-4:30 during weekdays, no appointment needed [address]  [place], [place] [address]

## 2024-08-19 DIAGNOSIS — M25562 Pain in left knee: Secondary | ICD-10-CM | POA: Diagnosis not present

## 2024-10-15 ENCOUNTER — Ambulatory Visit (HOSPITAL_COMMUNITY): Admission: EM | Admit: 2024-10-15 | Discharge: 2024-10-15 | Disposition: A | Discharge status: Home / Self Care

## 2024-10-15 ENCOUNTER — Encounter (HOSPITAL_COMMUNITY): Payer: Self-pay

## 2024-10-15 ENCOUNTER — Ambulatory Visit (HOSPITAL_COMMUNITY)

## 2024-10-15 DIAGNOSIS — E611 Iron deficiency: Secondary | ICD-10-CM | POA: Insufficient documentation

## 2024-10-15 DIAGNOSIS — E559 Vitamin D deficiency, unspecified: Secondary | ICD-10-CM | POA: Insufficient documentation

## 2024-10-15 DIAGNOSIS — H6593 Unspecified nonsuppurative otitis media, bilateral: Secondary | ICD-10-CM | POA: Diagnosis not present

## 2024-10-15 LAB — IRON AND TIBC
Iron: 74 ug/dL (ref 28–170)
Saturation Ratios: 17 % (ref 10.4–31.8)
TIBC: 427 ug/dL (ref 250–450)
UIBC: 353 ug/dL

## 2024-10-15 LAB — CBC WITH DIFFERENTIAL/PLATELET
Abs Immature Granulocytes: 0.02 K/uL (ref 0.00–0.07)
Basophils Absolute: 0 K/uL (ref 0.0–0.1)
Basophils Relative: 1 %
Eosinophils Absolute: 0 K/uL (ref 0.0–0.5)
Eosinophils Relative: 1 %
HCT: 37.9 % (ref 36.0–46.0)
Hemoglobin: 12.2 g/dL (ref 12.0–15.0)
Immature Granulocytes: 0 %
Lymphocytes Relative: 28 %
Lymphs Abs: 1.8 K/uL (ref 0.7–4.0)
MCH: 28.2 pg (ref 26.0–34.0)
MCHC: 32.2 g/dL (ref 30.0–36.0)
MCV: 87.7 fL (ref 80.0–100.0)
Monocytes Absolute: 0.5 K/uL (ref 0.1–1.0)
Monocytes Relative: 8 %
Neutro Abs: 4.1 K/uL (ref 1.7–7.7)
Neutrophils Relative %: 62 %
Platelets: 413 K/uL — ABNORMAL HIGH (ref 150–400)
RBC: 4.32 MIL/uL (ref 3.87–5.11)
RDW: 13.3 % (ref 11.5–15.5)
WBC: 6.4 K/uL (ref 4.0–10.5)
nRBC: 0 % (ref 0.0–0.2)

## 2024-10-15 LAB — VITAMIN D 25 HYDROXY (VIT D DEFICIENCY, FRACTURES): Vit D, 25-Hydroxy: 33.9 ng/mL (ref 30–100)

## 2024-10-15 MED ORDER — FLUTICASONE PROPIONATE 50 MCG/ACT NA SUSP: 1.0000 | Freq: Two times a day (BID) | NASAL | 2 refills | Status: AC | PRN

## 2024-10-15 MED ORDER — PREDNISONE 20 MG PO TABS: 40.0000 mg | ORAL_TABLET | Freq: Every day | ORAL | 0 refills | Status: AC

## 2024-10-15 NOTE — ED Triage Notes (Signed)
 Patient here today with c/o ear fullness and right ear pain since Monday. Patient states that the right ear is worse than the left. Patient has also been having some dizziness and nausea.   Patient has also noticed that she has been having more fatigue over the past week and would like to have her Vitamin D  and Iron checked.

## 2024-10-15 NOTE — ED Provider Notes (Signed)
 " [facility]  Note:  This document was prepared using Dragon voice recognition software and may include unintentional dictation errors.  MRN: [id number] DOB: 1975/10/04  Subjective:   Katrina Moss is a 49 y.o. female presenting for bilateral ear fullness, worse on the right since Monday.  Patient reports she had some dizziness and nausea yesterday which has mostly resolved today but is still concerned about the ear pressure and discomfort in the right ear.  Patient reports he has been using Flonase  nasal spray but has run out and needs a refill prescription.  Patient also having increased fatigue over the last week and was concerned for vitamin D  levels and iron as she has had deficiency in both previously.  Review of chart reveals low vitamin D  level in October, however iron levels were within normal range.  No shortness of breath, chest pain, weakness, fever.  Current Medications[1]   Allergies[2]  Past Medical History:  Diagnosis Date   Anemia    Hypertension    Vitamin D  deficiency      History reviewed. No pertinent surgical history.  Family History  Problem Relation Age of Onset   Breast cancer Mother        over 100 at onset per patient   Hypertension Mother    Hypertension Father    Diabetes Father    Stroke Maternal Grandmother    Glaucoma Paternal Grandmother    Kidney disease Paternal Grandfather    Colon polyps Neg Hx    Colon cancer Neg Hx    Esophageal cancer Neg Hx    Rectal cancer Neg Hx    Stomach cancer Neg Hx     Social History[3]  ROS Refer to HPI for ROS details.  Objective:    Vitals: BP 131/85 (BP Location: Left Arm)   Pulse 87   Temp 98.4 F (36.9 C) (Oral)   Resp 16   LMP 09/22/2024 (Approximate)   SpO2 98%   Physical Exam Vitals and nursing note reviewed.  Constitutional:      General: She is not in acute distress.    Appearance: Normal appearance. She is well-developed. She is not ill-appearing or  toxic-appearing.  HENT:     Head: Normocephalic and atraumatic.     Right Ear: Ear canal and external ear normal. Tenderness present. A middle ear effusion is present. Tympanic membrane is not injected, erythematous or bulging.     Left Ear: Ear canal and external ear normal. No tenderness. A middle ear effusion is present. Tympanic membrane is not injected, erythematous or bulging.     Nose: No congestion or rhinorrhea.     Mouth/Throat:     Mouth: Mucous membranes are moist.     Pharynx: Oropharynx is clear.  Cardiovascular:     Rate and Rhythm: Normal rate.  Pulmonary:     Effort: Pulmonary effort is normal. No respiratory distress.     Breath sounds: No stridor. No wheezing.  Musculoskeletal:        General: Normal range of motion.  Skin:    General: Skin is warm and dry.  Neurological:     General: No focal deficit present.     Mental Status: She is alert and oriented to person, place, and time.  Psychiatric:        Mood and Affect: Mood normal.        Behavior: Behavior normal.     Procedures  No results found for this or any previous visit (from the past 24  hours).  Assessment and Plan :     Discharge Instructions       1. Middle ear effusion, bilateral (Primary) - fluticasone  (FLONASE ) 50 MCG/ACT nasal spray; Place 1 spray into both nostrils 2 (two) times daily as needed for allergies or rhinitis.  Dispense: 16 g; Refill: 2 - predniSONE  (DELTASONE ) 20 MG tablet; Take 2 tablets (40 mg total) by mouth daily for 5 days.  Dispense: 10 tablet; Refill: 0  2. Iron deficiency - Iron and TIBC - CBC with Differential  3. Vitamin D  deficiency - VITAMIN D  25 Hydroxy (Vit-D Deficiency, Fractures) - For any abnormal findings to vitamin D  levels, CBC or iron, follow-up with primary care provider for treatment and ongoing management.  -Continue to monitor symptoms for any change in severity if there is any escalation of current symptoms or development of new symptoms  follow-up in ER for further evaluation and management.      Jekhi Bolin B Ziare Orrick    [1] No current facility-administered medications for this encounter.  Current Outpatient Medications:    fluticasone  (FLONASE ) 50 MCG/ACT nasal spray, Place 1 spray into both nostrils 2 (two) times daily as needed for allergies or rhinitis., Disp: 16 g, Rfl: 2   predniSONE  (DELTASONE ) 20 MG tablet, Take 2 tablets (40 mg total) by mouth daily for 5 days., Disp: 10 tablet, Rfl: 0   Cholecalciferol (VITAMIN D3) 1.25 MG (50000 UT) CAPS, Take 1 capsule (1.25 mg total) by mouth every 7 (seven) days., Disp: 12 capsule, Rfl: 0   MAGNESIUM PO, Take by mouth. (Patient not taking: Reported on 10/15/2024), Disp: , Rfl:  [2]  Allergies Allergen Reactions   Pamelor [Nortriptyline] Other (See Comments)    Stuttering, elevated BP  [3]  Social History Tobacco Use   Smoking status: Never    Passive exposure: Never   Smokeless tobacco: Never  Vaping Use   Vaping status: Never Used  Substance Use Topics   Alcohol use: Not Currently    Comment: 1x every 78month   Drug use: Never     Aurea Goodell B, NP 10/15/24 1314  "

## 2024-10-15 NOTE — Discharge Instructions (Signed)
" °  1. Middle ear effusion, bilateral (Primary) - fluticasone  (FLONASE ) 50 MCG/ACT nasal spray; Place 1 spray into both nostrils 2 (two) times daily as needed for allergies or rhinitis.  Dispense: 16 g; Refill: 2 - predniSONE  (DELTASONE ) 20 MG tablet; Take 2 tablets (40 mg total) by mouth daily for 5 days.  Dispense: 10 tablet; Refill: 0  2. Iron deficiency - Iron and TIBC - CBC with Differential  3. Vitamin D  deficiency - VITAMIN D  25 Hydroxy (Vit-D Deficiency, Fractures) - For any abnormal findings to vitamin D  levels, CBC or iron, follow-up with primary care provider for treatment and ongoing management.  -Continue to monitor symptoms for any change in severity if there is any escalation of current symptoms or development of new symptoms follow-up in ER for further evaluation and management. "

## 2024-10-16 ENCOUNTER — Ambulatory Visit (HOSPITAL_COMMUNITY): Payer: Self-pay

## 2024-12-15 ENCOUNTER — Ambulatory Visit: Admitting: Radiology
